# Patient Record
Sex: Male | Born: 1965
Health system: Southern US, Community
[De-identification: ages and names within clinical notes are randomized; demographics above are authoritative.]

## PROBLEM LIST (undated history)

## (undated) DIAGNOSIS — D239 Other benign neoplasm of skin, unspecified: Secondary | ICD-10-CM

## (undated) DIAGNOSIS — N529 Male erectile dysfunction, unspecified: Secondary | ICD-10-CM

## (undated) DIAGNOSIS — N400 Enlarged prostate without lower urinary tract symptoms: Secondary | ICD-10-CM

## (undated) DIAGNOSIS — I10 Essential (primary) hypertension: Secondary | ICD-10-CM

## (undated) DIAGNOSIS — E785 Hyperlipidemia, unspecified: Secondary | ICD-10-CM

## (undated) DIAGNOSIS — L281 Prurigo nodularis: Secondary | ICD-10-CM

## (undated) HISTORY — DX: Benign prostatic hyperplasia without lower urinary tract symptoms: N40.0

## (undated) HISTORY — DX: Other benign neoplasm of skin, unspecified: D23.9

## (undated) HISTORY — DX: Essential (primary) hypertension: I10

## (undated) HISTORY — DX: Prurigo nodularis: L28.1

## (undated) HISTORY — DX: Male erectile dysfunction, unspecified: N52.9

## (undated) HISTORY — DX: Hyperlipidemia, unspecified: E78.5

---

## 2010-06-18 HISTORY — PX: ROTATOR CUFF REPAIR: SHX139

## 2012-11-14 LAB — HM COLONOSCOPY

## 2015-09-29 DIAGNOSIS — I10 Essential (primary) hypertension: Secondary | ICD-10-CM | POA: Insufficient documentation

## 2015-09-29 DIAGNOSIS — N401 Enlarged prostate with lower urinary tract symptoms: Secondary | ICD-10-CM | POA: Insufficient documentation

## 2015-09-29 DIAGNOSIS — N529 Male erectile dysfunction, unspecified: Secondary | ICD-10-CM | POA: Insufficient documentation

## 2015-09-29 DIAGNOSIS — E785 Hyperlipidemia, unspecified: Secondary | ICD-10-CM

## 2015-09-29 DIAGNOSIS — N400 Enlarged prostate without lower urinary tract symptoms: Secondary | ICD-10-CM

## 2015-09-29 HISTORY — DX: Male erectile dysfunction, unspecified: N52.9

## 2015-09-29 HISTORY — DX: Hyperlipidemia, unspecified: E78.5

## 2015-09-29 HISTORY — DX: Essential (primary) hypertension: I10

## 2015-09-29 HISTORY — DX: Benign prostatic hyperplasia without lower urinary tract symptoms: N40.0

## 2015-12-01 DIAGNOSIS — M25511 Pain in right shoulder: Secondary | ICD-10-CM

## 2015-12-01 DIAGNOSIS — M75101 Unspecified rotator cuff tear or rupture of right shoulder, not specified as traumatic: Secondary | ICD-10-CM | POA: Insufficient documentation

## 2015-12-01 DIAGNOSIS — G8929 Other chronic pain: Secondary | ICD-10-CM | POA: Insufficient documentation

## 2016-02-27 DIAGNOSIS — M75101 Unspecified rotator cuff tear or rupture of right shoulder, not specified as traumatic: Secondary | ICD-10-CM

## 2016-02-27 DIAGNOSIS — M12811 Other specific arthropathies, not elsewhere classified, right shoulder: Secondary | ICD-10-CM | POA: Insufficient documentation

## 2016-08-17 LAB — LIPID PANEL
CHOLESTEROL: 128 (ref 0–200)
HDL: 36 (ref 35–70)
LDL CALC: 65
TRIGLYCERIDES: 156 (ref 40–160)

## 2016-08-17 LAB — PSA: PSA: 2.2

## 2016-08-17 LAB — HEPATIC FUNCTION PANEL
ALK PHOS: 63 (ref 25–125)
ALT: 31 (ref 10–40)
AST: 22 (ref 14–40)
Bilirubin, Total: 0.4

## 2016-08-17 LAB — BASIC METABOLIC PANEL
ALBUMIN: 4.4
BUN: 18 (ref 4–21)
CREATININE: 0.9 (ref 0.6–1.3)
Calcium: 9.7
Carbon Dioxide, Total: 27
Chloride: 104
Glucose: 95
POTASSIUM: 4.5 (ref 3.4–5.3)
Sodium: 140 (ref 137–147)
Total Protein: 7.2

## 2017-04-11 ENCOUNTER — Encounter: Payer: Self-pay | Admitting: Family Medicine

## 2017-04-11 ENCOUNTER — Ambulatory Visit (INDEPENDENT_AMBULATORY_CARE_PROVIDER_SITE_OTHER): Payer: Managed Care, Other (non HMO) | Admitting: Family Medicine

## 2017-04-11 VITALS — BP 112/73 | HR 81 | Ht 73.0 in | Wt 241.0 lb

## 2017-04-11 DIAGNOSIS — R3912 Poor urinary stream: Secondary | ICD-10-CM

## 2017-04-11 DIAGNOSIS — N401 Enlarged prostate with lower urinary tract symptoms: Secondary | ICD-10-CM

## 2017-04-11 DIAGNOSIS — N529 Male erectile dysfunction, unspecified: Secondary | ICD-10-CM

## 2017-04-11 DIAGNOSIS — I10 Essential (primary) hypertension: Secondary | ICD-10-CM | POA: Diagnosis not present

## 2017-04-11 DIAGNOSIS — E782 Mixed hyperlipidemia: Secondary | ICD-10-CM | POA: Diagnosis not present

## 2017-04-11 NOTE — Patient Instructions (Addendum)
Thank you for coming in today. We will refill your medicines as needed.  If all is well we recheck in June of 2019 for a well exam exam.  Recheck sooner if needed.  I recommend a flu shot.  Also let me know if you want to restart testosterone.

## 2017-04-11 NOTE — Progress Notes (Signed)
Anthony Lawson is a 51 y.o. male who presents to [facility] [place] today for establish care discussed hypertension hyperlipidemia BPH and erectile dysfunction.  Hypertension: Anthony Lawson takes lisinopril 10 mg daily.  He tolerates his medication well and notes that his blood pressures typically in the systolic range at home.  No chest pain palpitations or shortness of breath.  Hyperlipidemia: Anthony Lawson takes simvastatin daily.  He notes that his cholesterol which was tested in May 2018 is typically well controlled.  He tolerates the medication well with no muscle aches or pains fevers or chills.  BPH: Anthony Lawson has an enlarged prostate however his PSA was tested in May and was 2.2 in the normal range.  He notes mild symptoms but denies symptoms that are obnoxious enough to require treatment.  He is happy with watchful waiting.  Erectile dysfunction: Anthony Lawson has mild erectile dysfunction and takes sildenafil intermittently.  He satisfied with how this is going.  Low testosterone.  Dog has been diagnosed with low testosterone previously.  He did pretty well with topical testosterone but notes it is really expensive.  Off of the testosterone he notes slightly decreased libido and increased fatigue but overall is pretty satisfied with how things are going.  Family history of aortic aneurysm: Anthony Lawson notes multiple family members with aortic aneurysms or aortic dissection.  He is worried about his individual risk and would like a screening test if possible.  He is currently asymptomatic.  Past Medical History:  Diagnosis Date  . BPH (benign prostatic hyperplasia) 09/29/2015  . ED (erectile dysfunction) 09/29/2015  . HTN (hypertension) 09/29/2015  . Hyperlipidemia 09/29/2015   Past Surgical History:  Procedure Laterality Date  . ROTATOR CUFF REPAIR Left 2012   Social History  Substance Use Topics  .  Smoking status: Never Smoker  . Smokeless tobacco: Never Used  . Alcohol use Yes   family history includes Cancer in his mother; Diabetes in his maternal aunt; Heart disease in his brother and father; Hypertension in his father.  ROS as above: No headache, visual changes, nausea, vomiting, diarrhea, constipation, dizziness, abdominal pain, skin rash, fevers, chills, night sweats, weight loss, swollen lymph nodes, body aches, joint swelling, muscle aches, chest pain, shortness of breath, mood changes, visual or auditory hallucinations.   Medications: Current Outpatient Prescriptions  Medication Sig Dispense Refill  . lisinopril (PRINIVIL,ZESTRIL) 10 MG tablet Take by mouth.    . sildenafil (REVATIO) 20 MG tablet Take 1-5 po as needed, not more than once a day    . simvastatin (ZOCOR) 20 MG tablet Take by mouth.     No current facility-administered medications for this visit.    No Known Allergies  Health Maintenance Health Maintenance  Topic Date Due  . HIV Screening  12/20/1980  . COLONOSCOPY  12/21/2015  . INFLUENZA VACCINE  04/11/2018 (Originally 01/16/2017)  . TETANUS/TDAP  12/03/2022     Exam:  BP 112/73   Pulse 81   Ht 6\' 1"  (1.854 m)   Wt 241 lb (109.3 kg)   BMI 31.80 kg/m  Gen: Well NAD HEENT: EOMI,  MMM Lungs: Normal work of breathing. CTABL Heart: RRR no MRG Abd: NABS, Soft. Nondistended, Nontender no palpable pulsatile masses present.  No bruits are present Exts: Brisk capillary refill, warm and well perfused.    No results found for this or any previous visit (from the past 72 hour(s)). No results found.    Assessment and Plan: 51 y.o. male  with  Hypertension well controlled continue current regimen.  Refill medications when needed  Hyperlipidemia well controlled continue current regimen.  Refill medications as needed recheck labs in May 2019.  BPH doing pretty well.  Continue to follow along recheck PSA May 2019.  Erectile dysfunction doing well  refill sildenafil as needed.  Family history of aortic aneurysm: We discussed options.  He does not meet standard criteria for screening for abdominal aortic aneurysm however I think it is reasonable to proceed with a low test for aneurysm.  He understands that he may have to pay for this radiology procedure.  Testosterone: We discussed options plan for watchful waiting.  Orders Placed This Encounter  Procedures  . [place] Screening AAA    Standing Status:   Future    Standing Expiration Date:   06/11/2018    Order Specific Question:   Reason for Exam (SYMPTOM  OR DIAGNOSIS REQUIRED)    Answer:   eval ? AAA. Strong family history and personal history of HTN, HLD    Order Specific Question:   Preferred imaging location?    Answer:   [facility]   Meds ordered this encounter  Medications  . lisinopril (PRINIVIL,ZESTRIL) 10 MG tablet    Sig: Take by mouth.  . sildenafil (REVATIO) 20 MG tablet    Sig: Take 1-5 po as needed, not more than once a day  . simvastatin (ZOCOR) 20 MG tablet    Sig: Take by mouth.     Discussed warning signs or symptoms. Please see discharge instructions. Patient expresses understanding.

## 2017-04-17 ENCOUNTER — Other Ambulatory Visit: Payer: Self-pay | Admitting: Family Medicine

## 2017-04-17 ENCOUNTER — Ambulatory Visit (INDEPENDENT_AMBULATORY_CARE_PROVIDER_SITE_OTHER): Payer: Managed Care, Other (non HMO)

## 2017-04-17 DIAGNOSIS — I10 Essential (primary) hypertension: Secondary | ICD-10-CM

## 2017-04-17 DIAGNOSIS — I77811 Abdominal aortic ectasia: Secondary | ICD-10-CM | POA: Diagnosis not present

## 2017-04-17 DIAGNOSIS — I700 Atherosclerosis of aorta: Secondary | ICD-10-CM | POA: Diagnosis not present

## 2017-04-17 DIAGNOSIS — E782 Mixed hyperlipidemia: Secondary | ICD-10-CM

## 2017-04-18 ENCOUNTER — Encounter: Payer: Self-pay | Admitting: Family Medicine

## 2017-04-18 DIAGNOSIS — I77811 Abdominal aortic ectasia: Secondary | ICD-10-CM | POA: Insufficient documentation

## 2017-05-01 NOTE — Progress Notes (Signed)
11/14/12 

## 2017-05-31 ENCOUNTER — Encounter: Payer: Self-pay | Admitting: Family Medicine

## 2017-08-22 DIAGNOSIS — L281 Prurigo nodularis: Secondary | ICD-10-CM

## 2017-08-22 HISTORY — DX: Prurigo nodularis: L28.1

## 2017-10-01 HISTORY — PX: OTHER SURGICAL HISTORY: SHX169

## 2018-01-02 ENCOUNTER — Ambulatory Visit (INDEPENDENT_AMBULATORY_CARE_PROVIDER_SITE_OTHER): Payer: 59 | Admitting: Family Medicine

## 2018-01-02 ENCOUNTER — Encounter: Payer: Self-pay | Admitting: Family Medicine

## 2018-01-02 VITALS — BP 137/80 | HR 66 | Temp 97.9°F | Wt 230.4 lb

## 2018-01-02 DIAGNOSIS — Z1211 Encounter for screening for malignant neoplasm of colon: Secondary | ICD-10-CM

## 2018-01-02 DIAGNOSIS — N529 Male erectile dysfunction, unspecified: Secondary | ICD-10-CM

## 2018-01-02 DIAGNOSIS — R350 Frequency of micturition: Secondary | ICD-10-CM

## 2018-01-02 DIAGNOSIS — Z0000 Encounter for general adult medical examination without abnormal findings: Secondary | ICD-10-CM

## 2018-01-02 DIAGNOSIS — N401 Enlarged prostate with lower urinary tract symptoms: Secondary | ICD-10-CM | POA: Diagnosis not present

## 2018-01-02 DIAGNOSIS — E782 Mixed hyperlipidemia: Secondary | ICD-10-CM

## 2018-01-02 DIAGNOSIS — Z125 Encounter for screening for malignant neoplasm of prostate: Secondary | ICD-10-CM

## 2018-01-02 DIAGNOSIS — I77811 Abdominal aortic ectasia: Secondary | ICD-10-CM

## 2018-01-02 DIAGNOSIS — I10 Essential (primary) hypertension: Secondary | ICD-10-CM | POA: Diagnosis not present

## 2018-01-02 MED ORDER — LISINOPRIL 10 MG PO TABS: 10.0000 mg | ORAL_TABLET | Freq: Every day | ORAL | 3 refills | Status: DC

## 2018-01-02 MED ORDER — SIMVASTATIN 20 MG PO TABS: 20.0000 mg | ORAL_TABLET | Freq: Every day | ORAL | 3 refills | Status: DC

## 2018-01-02 MED ORDER — SILDENAFIL CITRATE 100 MG PO TABS: 50.0000 mg | ORAL_TABLET | Freq: Every day | ORAL | 11 refills | Status: DC | PRN

## 2018-01-02 MED ORDER — TAMSULOSIN HCL 0.4 MG PO CAPS: 0.4000 mg | ORAL_CAPSULE | Freq: Every day | ORAL | 3 refills | Status: DC

## 2018-01-02 NOTE — Progress Notes (Signed)
Anthony Lawson is a 52 y.o. male who presents to [facility]: [place] today for well adult visit.   Anthony Lawson is doing well overall.  He is attempting to lose weight by eating a healthier diet has lost over 10 pounds.  Additionally he has run out of his cholesterol medication is been out of the simvastatin for about 2 months.  He tolerates it well without any aches and pains.  Additionally he notes that he has been seen by urologist last year and found to have BPH.  He is having some nocturia and urinary frequency symptoms and is interested in medications if possible.  Symptoms have not changed much in the last year and a half.  Additionally has been to a dermatologist at [facility] dermatology and had several moles removed that were noncancerous.  Additionally he takes Viagra occasionally which helps a lot.  He like a refill if possible.   ROS as above:  Past Medical History:  Diagnosis Date  . BPH (benign prostatic hyperplasia) 09/29/2015  . ED (erectile dysfunction) 09/29/2015  . HTN (hypertension) 09/29/2015  . Hyperlipidemia 09/29/2015   Past Surgical History:  Procedure Laterality Date  . ROTATOR CUFF REPAIR Left 2012   Social History   Tobacco Use  . Smoking status: Never Smoker  . Smokeless tobacco: Never Used  Substance Use Topics  . Alcohol use: Yes   family history includes Cancer in his mother; Diabetes in his maternal aunt; Heart disease in his brother and father; Hypertension in his father.  Medications: Current Outpatient Medications  Medication Sig Dispense Refill  . lisinopril (PRINIVIL,ZESTRIL) 10 MG tablet Take 1 tablet (10 mg total) by mouth daily. 90 tablet 3  . sildenafil (REVATIO) 20 MG tablet Take 1-5 po as needed, not more than once a day    . simvastatin (ZOCOR) 20 MG tablet Take 1 tablet (20 mg total) by mouth daily. 90 tablet 3  . sildenafil  (VIAGRA) 100 MG tablet Take 0.5-1 tablets (50-100 mg total) by mouth daily as needed for erectile dysfunction. 30 tablet 11  . tamsulosin (FLOMAX) 0.4 MG CAPS capsule Take 1 capsule (0.4 mg total) by mouth daily. 90 capsule 3   No current facility-administered medications for this visit.    No Known Allergies  Health Maintenance Health Maintenance  Topic Date Due  . COLONOSCOPY  11/14/2017  . INFLUENZA VACCINE  04/11/2018 (Originally 01/16/2018)  . HIV Screening  01/03/2019 (Originally [birth year]/20/1980)  . TETANUS/TDAP  12/03/2022     Exam:  BP 137/80 (BP Location: Right Arm, Patient Position: Sitting, Cuff Size: Normal)   Pulse 66   Temp 97.9 F (36.6 C) (Oral)   Wt 230 lb 6.4 oz (104.5 kg)   BMI 30.40 kg/m  Wt Readings from Last 5 Encounters:  01/02/18 230 lb 6.4 oz (104.5 kg)  04/11/17 241 lb (109.3 kg)     Gen: Well NAD HEENT: EOMI,  MMM Lungs: Normal work of breathing. CTABL Heart: RRR no MRG Abd: NABS, Soft. Nondistended, Nontender Exts: Brisk capillary refill, warm and well perfused.  Psych: Alert and oriented normal speech thought process and affect. Skin fair skin with multiple freckles and nevi.  None severely dysplastic appearing.  Depression screen [facility] 2/9 01/02/2018 04/11/2017  Decreased Interest 0 0  Down, Depressed, Hopeless 0 0  PHQ - 2 Score 0 0  Altered sleeping 0 -  Tired, decreased energy 0 -  Change in appetite 0 -  Feeling  bad or failure about yourself  0 -  Trouble concentrating 0 -  Moving slowly or fidgety/restless 0 -  Suicidal thoughts 0 -  PHQ-9 Score 0 -       Lab and Radiology Results   Chemistry      Component Value Date/Time   NA 140 08/17/2016   K 4.5 08/17/2016   CL 104 08/17/2016   CO2 27 08/17/2016   BUN 18 08/17/2016   CREATININE 0.9 08/17/2016   GLU 95 08/17/2016      Component Value Date/Time   CALCIUM 9.7 08/17/2016   ALKPHOS 63 08/17/2016   AST 22 08/17/2016   ALT 31 08/17/2016     Lab Results  Component Value  Date   CHOL 128 08/17/2016   HDL 36 08/17/2016   LDLCALC 65 08/17/2016   TRIG 156 08/17/2016      Assessment and Plan: 52 y.o. male with well adult. Doing well.  Patient is actively working on weight loss and having success.  He has been out of his simvastatin.  Plan to restart simvastatin and recheck lipids in about 6 weeks with regular basic fasting labs listed below.    Additionally will check PSA for prostate cancer screening and for BPH.  And start Flomax trial.  Recommend continue dermatology skin surveillance checks.  Will request medical records.  Plan to refer to gastroenterology for colon cancer screening colonoscopy.  Last colonoscopy 5 years ago normal initial gastrology note recommends recheck in 5 years.  Patient is not sure why.  I think is reasonable to discuss with gastrology before repeating a colonoscopy.  Document in scanned document.     Orders Placed This Encounter  Procedures  . CBC  . COMPLETE METABOLIC PANEL WITH GFR  . Lipid Panel w/reflex Direct LDL  . PSA  . Ambulatory referral to Gastroenterology    Referral Priority:   Routine    Referral Type:   Consultation    Referral Reason:   Specialty Services Required    Number of Visits Requested:   1   Meds ordered this encounter  Medications  . lisinopril (PRINIVIL,ZESTRIL) 10 MG tablet    Sig: Take 1 tablet (10 mg total) by mouth daily.    Dispense:  90 tablet    Refill:  3  . simvastatin (ZOCOR) 20 MG tablet    Sig: Take 1 tablet (20 mg total) by mouth daily.    Dispense:  90 tablet    Refill:  3  . tamsulosin (FLOMAX) 0.4 MG CAPS capsule    Sig: Take 1 capsule (0.4 mg total) by mouth daily.    Dispense:  90 capsule    Refill:  3  . sildenafil (VIAGRA) 100 MG tablet    Sig: Take 0.5-1 tablets (50-100 mg total) by mouth daily as needed for erectile dysfunction.    Dispense:  30 tablet    Refill:  11     Discussed warning signs or symptoms. Please see discharge instructions. Patient  expresses understanding.

## 2018-01-02 NOTE — Patient Instructions (Signed)
Thank you for coming in today. Resume medicine.  Get fasting labs in about 6 weeks.  You should hear about gastroenterology office.  Return yearly or sooner if needed.

## 2018-01-29 ENCOUNTER — Encounter: Payer: Self-pay | Admitting: Family Medicine

## 2018-01-29 ENCOUNTER — Telehealth: Payer: Self-pay | Admitting: Family Medicine

## 2018-01-29 NOTE — Telephone Encounter (Signed)
Records received from [facility] dermatology. Dysplastic nevi removed from left scapular back October 01, 2017. Irritated lesion injected left leg March 2019  Some of the records are not not readable and will be asked to refax.  However most of the notes are ratable and will be sent to scan as per relevant.

## 2018-02-03 ENCOUNTER — Encounter: Payer: Self-pay | Admitting: Family Medicine

## 2018-02-25 ENCOUNTER — Telehealth: Payer: Self-pay | Admitting: Family Medicine

## 2018-02-25 NOTE — Telephone Encounter (Signed)
Left VM for Pt to return clinic call.  

## 2018-02-25 NOTE — Telephone Encounter (Signed)
[place] [facility] gastroenterology had difficulty contacting you for colonoscopy.  Please let me know if you need any help or would like to have their phone number so you can call them to schedule your colonoscopy.

## 2018-03-21 DIAGNOSIS — M62830 Muscle spasm of back: Secondary | ICD-10-CM | POA: Diagnosis not present

## 2018-03-21 DIAGNOSIS — M47813 Spondylosis without myelopathy or radiculopathy, cervicothoracic region: Secondary | ICD-10-CM | POA: Diagnosis not present

## 2018-03-21 DIAGNOSIS — M47816 Spondylosis without myelopathy or radiculopathy, lumbar region: Secondary | ICD-10-CM | POA: Diagnosis not present

## 2018-03-21 DIAGNOSIS — M545 Low back pain: Secondary | ICD-10-CM | POA: Diagnosis not present

## 2018-04-01 DIAGNOSIS — M47816 Spondylosis without myelopathy or radiculopathy, lumbar region: Secondary | ICD-10-CM | POA: Diagnosis not present

## 2018-04-01 DIAGNOSIS — M47813 Spondylosis without myelopathy or radiculopathy, cervicothoracic region: Secondary | ICD-10-CM | POA: Diagnosis not present

## 2018-04-01 DIAGNOSIS — M62830 Muscle spasm of back: Secondary | ICD-10-CM | POA: Diagnosis not present

## 2018-04-01 DIAGNOSIS — M545 Low back pain: Secondary | ICD-10-CM | POA: Diagnosis not present

## 2018-04-03 DIAGNOSIS — M47816 Spondylosis without myelopathy or radiculopathy, lumbar region: Secondary | ICD-10-CM | POA: Diagnosis not present

## 2018-04-03 DIAGNOSIS — M545 Low back pain: Secondary | ICD-10-CM | POA: Diagnosis not present

## 2018-04-03 DIAGNOSIS — M62830 Muscle spasm of back: Secondary | ICD-10-CM | POA: Diagnosis not present

## 2018-04-03 DIAGNOSIS — M47813 Spondylosis without myelopathy or radiculopathy, cervicothoracic region: Secondary | ICD-10-CM | POA: Diagnosis not present

## 2018-04-08 DIAGNOSIS — I10 Essential (primary) hypertension: Secondary | ICD-10-CM | POA: Diagnosis not present

## 2018-04-08 DIAGNOSIS — N529 Male erectile dysfunction, unspecified: Secondary | ICD-10-CM | POA: Diagnosis not present

## 2018-04-08 DIAGNOSIS — E782 Mixed hyperlipidemia: Secondary | ICD-10-CM | POA: Diagnosis not present

## 2018-04-08 DIAGNOSIS — R350 Frequency of micturition: Secondary | ICD-10-CM | POA: Diagnosis not present

## 2018-04-09 LAB — CBC
HCT: 44.6 % (ref 38.5–50.0)
HEMOGLOBIN: 15.8 g/dL (ref 13.2–17.1)
MCH: 30.7 pg (ref 27.0–33.0)
MCHC: 35.4 g/dL (ref 32.0–36.0)
MCV: 86.6 fL (ref 80.0–100.0)
MPV: 10.2 fL (ref 7.5–12.5)
Platelets: 264 10*3/uL (ref 140–400)
RBC: 5.15 10*6/uL (ref 4.20–5.80)
RDW: 12.7 % (ref 11.0–15.0)
WBC: 6.3 10*3/uL (ref 3.8–10.8)

## 2018-04-09 LAB — COMPLETE METABOLIC PANEL WITH GFR
AG Ratio: 1.6 (calc) (ref 1.0–2.5)
ALBUMIN MSPROF: 4.4 g/dL (ref 3.6–5.1)
ALT: 30 U/L (ref 9–46)
AST: 21 U/L (ref 10–35)
Alkaline phosphatase (APISO): 55 U/L (ref 40–115)
BUN: 15 mg/dL (ref 7–25)
CALCIUM: 9.6 mg/dL (ref 8.6–10.3)
CO2: 25 mmol/L (ref 20–32)
CREATININE: 0.89 mg/dL (ref 0.70–1.33)
Chloride: 103 mmol/L (ref 98–110)
GFR, EST NON AFRICAN AMERICAN: 98 mL/min/{1.73_m2} (ref >=60)
GFR, Est African American: 114 mL/min/{1.73_m2} (ref >=60)
Globulin: 2.8 g/dL (calc) (ref 1.9–3.7)
Glucose, Bld: 92 mg/dL (ref 65–99)
Potassium: 4.2 mmol/L (ref 3.5–5.3)
SODIUM: 138 mmol/L (ref 135–146)
Total Bilirubin: 0.5 mg/dL (ref 0.2–1.2)
Total Protein: 7.2 g/dL (ref 6.1–8.1)

## 2018-04-09 LAB — LIPID PANEL W/REFLEX DIRECT LDL
CHOL/HDL RATIO: 3.8 (calc) (ref <5.0)
Cholesterol: 146 mg/dL (ref <200)
HDL: 38 mg/dL — AB (ref >40)
LDL Cholesterol (Calc): 82 mg/dL (calc)
NON-HDL CHOLESTEROL (CALC): 108 mg/dL (ref <130)
Triglycerides: 164 mg/dL — ABNORMAL HIGH (ref <150)

## 2018-04-09 LAB — PSA: PSA: 2.6 ng/mL (ref <=4.0)

## 2018-04-11 DIAGNOSIS — M47813 Spondylosis without myelopathy or radiculopathy, cervicothoracic region: Secondary | ICD-10-CM | POA: Diagnosis not present

## 2018-04-11 DIAGNOSIS — M62830 Muscle spasm of back: Secondary | ICD-10-CM | POA: Diagnosis not present

## 2018-04-11 DIAGNOSIS — M545 Low back pain: Secondary | ICD-10-CM | POA: Diagnosis not present

## 2018-04-11 DIAGNOSIS — M47816 Spondylosis without myelopathy or radiculopathy, lumbar region: Secondary | ICD-10-CM | POA: Diagnosis not present

## 2018-04-18 DIAGNOSIS — M545 Low back pain: Secondary | ICD-10-CM | POA: Diagnosis not present

## 2018-04-18 DIAGNOSIS — M62830 Muscle spasm of back: Secondary | ICD-10-CM | POA: Diagnosis not present

## 2018-04-18 DIAGNOSIS — M47816 Spondylosis without myelopathy or radiculopathy, lumbar region: Secondary | ICD-10-CM | POA: Diagnosis not present

## 2018-04-18 DIAGNOSIS — M47813 Spondylosis without myelopathy or radiculopathy, cervicothoracic region: Secondary | ICD-10-CM | POA: Diagnosis not present

## 2018-04-21 DIAGNOSIS — M47813 Spondylosis without myelopathy or radiculopathy, cervicothoracic region: Secondary | ICD-10-CM | POA: Diagnosis not present

## 2018-04-21 DIAGNOSIS — M47816 Spondylosis without myelopathy or radiculopathy, lumbar region: Secondary | ICD-10-CM | POA: Diagnosis not present

## 2018-04-21 DIAGNOSIS — M62830 Muscle spasm of back: Secondary | ICD-10-CM | POA: Diagnosis not present

## 2018-04-21 DIAGNOSIS — M545 Low back pain: Secondary | ICD-10-CM | POA: Diagnosis not present

## 2018-05-02 DIAGNOSIS — M545 Low back pain: Secondary | ICD-10-CM | POA: Diagnosis not present

## 2018-05-02 DIAGNOSIS — M62830 Muscle spasm of back: Secondary | ICD-10-CM | POA: Diagnosis not present

## 2018-05-02 DIAGNOSIS — M47813 Spondylosis without myelopathy or radiculopathy, cervicothoracic region: Secondary | ICD-10-CM | POA: Diagnosis not present

## 2018-05-02 DIAGNOSIS — M47816 Spondylosis without myelopathy or radiculopathy, lumbar region: Secondary | ICD-10-CM | POA: Diagnosis not present

## 2018-05-05 DIAGNOSIS — M62830 Muscle spasm of back: Secondary | ICD-10-CM | POA: Diagnosis not present

## 2018-05-05 DIAGNOSIS — M47816 Spondylosis without myelopathy or radiculopathy, lumbar region: Secondary | ICD-10-CM | POA: Diagnosis not present

## 2018-05-05 DIAGNOSIS — M47813 Spondylosis without myelopathy or radiculopathy, cervicothoracic region: Secondary | ICD-10-CM | POA: Diagnosis not present

## 2018-05-05 DIAGNOSIS — M545 Low back pain: Secondary | ICD-10-CM | POA: Diagnosis not present

## 2018-05-12 DIAGNOSIS — M47816 Spondylosis without myelopathy or radiculopathy, lumbar region: Secondary | ICD-10-CM | POA: Diagnosis not present

## 2018-05-12 DIAGNOSIS — M62830 Muscle spasm of back: Secondary | ICD-10-CM | POA: Diagnosis not present

## 2018-05-12 DIAGNOSIS — M545 Low back pain: Secondary | ICD-10-CM | POA: Diagnosis not present

## 2018-05-12 DIAGNOSIS — M47813 Spondylosis without myelopathy or radiculopathy, cervicothoracic region: Secondary | ICD-10-CM | POA: Diagnosis not present

## 2018-05-19 ENCOUNTER — Encounter: Payer: Self-pay | Admitting: Family Medicine

## 2018-05-19 ENCOUNTER — Ambulatory Visit: Payer: 59 | Admitting: Family Medicine

## 2018-05-19 ENCOUNTER — Ambulatory Visit (INDEPENDENT_AMBULATORY_CARE_PROVIDER_SITE_OTHER): Payer: BLUE CROSS/BLUE SHIELD

## 2018-05-19 VITALS — BP 153/88 | HR 80 | Wt 236.0 lb

## 2018-05-19 DIAGNOSIS — M76891 Other specified enthesopathies of right lower limb, excluding foot: Secondary | ICD-10-CM

## 2018-05-19 DIAGNOSIS — M25551 Pain in right hip: Secondary | ICD-10-CM | POA: Diagnosis not present

## 2018-05-19 NOTE — Progress Notes (Signed)
Note duplication error 

## 2018-05-19 NOTE — Patient Instructions (Signed)
Thank you for coming in today. Do the stretching as much as you can.  Get xray now on your way out.  Attend Pt.  Do the exercises about 30 reps 2-3x daily.  Go from the short position to long slowly against resistance.

## 2018-05-19 NOTE — Progress Notes (Signed)
Anthony Lawson is a 52 y.o. male who presents to [facility] today for right groin and lateral thigh pain. Anthony Lawson was doing sit-ups 2.5 weeks ago, and felt a pulling in his right groin. The pain spread to his lateral right thigh and right lower back. He notes that it feels like a cramp - similar to a calf cramp. He does have some numbness on his right lateral thigh. He feels alright when he is sitting, but when he stands up the pain worsens in his groin. He has difficulty sleeping at night, because the pain is worse when he lays on his back. He has been stretching and using Advil without relief. He denies difficulty getting out of the car or putting on his shoes.   Patient has an established relationship with a chiropractor and has been receiving chiropractic care for this injury for the last 2-1/2 weeks or so.  He notes it is not improving.  ROS:  As above  Exam:  BP (!) 153/88   Pulse 80   Wt 236 lb (107 kg)   BMI 31.14 kg/m  General: Well Developed, well nourished, and in no acute distress.  Neuro/Psych: Alert and oriented x3, extra-ocular muscles intact, able to move all 4 extremities, sensation grossly intact. Skin: Warm and dry, no rashes noted.  Respiratory: Not using accessory muscles, speaking in full sentences, trachea midline.  Cardiovascular: Pulses palpable, no extremity edema. Abdomen: Does not appear distended. MSK:  Back: No tenderness to palpation.  Pain reproduced with back extension and lateral rotation to the right.  Otherwise normal ROM.  Strength 5/5 when standing on toes and heels.  Pulses and capillary refill normal.  Right hip: No tenderness to palpation. Normal ROM.  Strength 5/5 with internal and external rotation, hip flexion, and hip abduction when seated. Pain reproduced with resisted hip flexion and strength 4/5 right hip flexion with supine position  Left hip: Normal appearing, normal ROM,  strength intact. Pulses and capillary refill normal.    Lab and Radiology Results No results found for this or any previous visit (from the past 72 hour(s)). Dg Hip Unilat With Pelvis 2-3 Views Right  Result Date: 05/19/2018 CLINICAL DATA:  Acute right hip pain without definite injury. EXAM: DG HIP (WITH OR WITHOUT PELVIS) 2-3V RIGHT COMPARISON:  None. FINDINGS: There is no evidence of hip fracture or dislocation. There is no evidence of arthropathy or other focal bone abnormality. IMPRESSION: Negative. Electronically Signed   By: Marijo Conception, M.D.   On: 05/19/2018 19:39  I personally (independently) visualized and performed the interpretation of the images attached in this note. .    Assessment and Plan: 52 y.o. male with  Hip flexor strain: Anthony Lawson likely strained his hip flexor while doing sit-ups 2.5 weeks ago. His physical exam revealed pain with hip flexion and back extension. X-ray revealed largely normal exam. Advised PT - However, pt unlikely to make it to many appointments as he travels for work. Gave work-out bands and demonstrated exercises for hip flexor strengthening and stretching.   Recheck in a few weeks if not improving.     Orders Placed This Encounter  Procedures  . DG HIP UNILAT WITH PELVIS 2-3 VIEWS RIGHT    Standing Status:   Future    Number of Occurrences:   1    Standing Expiration Date:   07/21/2019    Order Specific Question:   Reason for Exam (SYMPTOM  OR DIAGNOSIS  REQUIRED)    Answer:   eval pain gorin suspect hip flexor strain    Order Specific Question:   Preferred imaging location?    Answer:   [facility]    Order Specific Question:   Radiology Contrast Protocol - do NOT remove file path    Answer:   [email or web address]  . Ambulatory referral to Physical Therapy    Referral Priority:   Routine    Referral Type:   Physical Medicine    Referral Reason:   Specialty Services Required     Requested Specialty:   Physical Therapy   No orders of the defined types were placed in this encounter.   Historical information moved to improve visibility of documentation.  Past Medical History:  Diagnosis Date  . BPH (benign prostatic hyperplasia) 09/29/2015  . ED (erectile dysfunction) 09/29/2015  . HTN (hypertension) 09/29/2015  . Hyperlipidemia 09/29/2015  . Multiple dysplastic nevi   . Prurigo nodularis 08/22/2017   Injected left lateral lower leg [facility] dermatology   Past Surgical History:  Procedure Laterality Date  . Dysplastic nevi removal left scapular back April 2019 Left 10/01/2017   [facility]  . ROTATOR CUFF REPAIR Left 2012   Social History   Tobacco Use  . Smoking status: Never Smoker  . Smokeless tobacco: Never Used  Substance Use Topics  . Alcohol use: Yes   family history includes Cancer in his mother; Diabetes in his maternal aunt; Heart disease in his brother and father; Hypertension in his father.  Medications: Current Outpatient Medications  Medication Sig Dispense Refill  . lisinopril (PRINIVIL,ZESTRIL) 10 MG tablet Take 1 tablet (10 mg total) by mouth daily. 90 tablet 3  . sildenafil (REVATIO) 20 MG tablet Take 1-5 po as needed, not more than once a day    . sildenafil (VIAGRA) 100 MG tablet Take 0.5-1 tablets (50-100 mg total) by mouth daily as needed for erectile dysfunction. 30 tablet 11  . simvastatin (ZOCOR) 20 MG tablet Take 1 tablet (20 mg total) by mouth daily. 90 tablet 3  . tamsulosin (FLOMAX) 0.4 MG CAPS capsule Take 1 capsule (0.4 mg total) by mouth daily. 90 capsule 3   No current facility-administered medications for this visit.    No Known Allergies    Discussed warning signs or symptoms. Please see discharge instructions. Patient expresses understanding.   I personally was present and performed or re-performed the history, physical exam and medical decision-making activities of this service and have  verified that the service and findings are accurately documented in the student's note. ___________________________________________ Lynne Leader M.D., ABFM., CAQSM. Primary Care and Sports Medicine Adjunct Instructor of [employer]

## 2018-10-30 ENCOUNTER — Telehealth: Payer: Self-pay | Admitting: Family Medicine

## 2018-10-30 DIAGNOSIS — E782 Mixed hyperlipidemia: Secondary | ICD-10-CM

## 2018-10-30 DIAGNOSIS — I10 Essential (primary) hypertension: Secondary | ICD-10-CM

## 2018-10-30 NOTE — Telephone Encounter (Signed)
Patient states that her and husband need lab work called in so they can then schedule a virtual visit with PCP. States that PCP is aware of labs needed. Please advise.

## 2018-10-30 NOTE — Telephone Encounter (Signed)
Pt advised.

## 2018-10-30 NOTE — Telephone Encounter (Signed)
Labs ordered in preparation for upcoming visit.

## 2018-11-04 LAB — LIPID PANEL
Cholesterol: 129 mg/dL (ref <200)
HDL: 38 mg/dL — ABNORMAL LOW (ref >=40)
LDL Cholesterol (Calc): 74 mg/dL (calc)
Non-HDL Cholesterol (Calc): 91 mg/dL (calc) (ref <130)
Total CHOL/HDL Ratio: 3.4 (calc) (ref <5.0)
Triglycerides: 91 mg/dL (ref <150)

## 2018-11-04 LAB — CBC
HCT: 45.5 % (ref 38.5–50.0)
Hemoglobin: 15.5 g/dL (ref 13.2–17.1)
MCH: 29.8 pg (ref 27.0–33.0)
MCHC: 34.1 g/dL (ref 32.0–36.0)
MCV: 87.3 fL (ref 80.0–100.0)
MPV: 10.6 fL (ref 7.5–12.5)
Platelets: 272 10*3/uL (ref 140–400)
RBC: 5.21 10*6/uL (ref 4.20–5.80)
RDW: 12.2 % (ref 11.0–15.0)
WBC: 6.2 10*3/uL (ref 3.8–10.8)

## 2018-11-04 LAB — COMPLETE METABOLIC PANEL WITH GFR
AG Ratio: 1.8 (calc) (ref 1.0–2.5)
ALT: 24 U/L (ref 9–46)
AST: 19 U/L (ref 10–35)
Albumin: 4.4 g/dL (ref 3.6–5.1)
Alkaline phosphatase (APISO): 68 U/L (ref 35–144)
BUN: 15 mg/dL (ref 7–25)
CO2: 25 mmol/L (ref 20–32)
Calcium: 9.4 mg/dL (ref 8.6–10.3)
Chloride: 106 mmol/L (ref 98–110)
Creat: 0.97 mg/dL (ref 0.70–1.33)
GFR, Est African American: 104 mL/min/{1.73_m2} (ref >=60)
GFR, Est Non African American: 89 mL/min/{1.73_m2} (ref >=60)
Globulin: 2.4 g/dL (calc) (ref 1.9–3.7)
Glucose, Bld: 96 mg/dL (ref 65–99)
Potassium: 4.2 mmol/L (ref 3.5–5.3)
Sodium: 138 mmol/L (ref 135–146)
Total Bilirubin: 0.6 mg/dL (ref 0.2–1.2)
Total Protein: 6.8 g/dL (ref 6.1–8.1)

## 2018-11-24 ENCOUNTER — Ambulatory Visit (INDEPENDENT_AMBULATORY_CARE_PROVIDER_SITE_OTHER): Payer: Managed Care, Other (non HMO)

## 2018-11-24 ENCOUNTER — Encounter: Payer: Self-pay | Admitting: Family Medicine

## 2018-11-24 ENCOUNTER — Ambulatory Visit (INDEPENDENT_AMBULATORY_CARE_PROVIDER_SITE_OTHER): Payer: Managed Care, Other (non HMO) | Admitting: Family Medicine

## 2018-11-24 ENCOUNTER — Other Ambulatory Visit: Payer: Self-pay

## 2018-11-24 VITALS — BP 131/74 | HR 66 | Temp 97.9°F | Wt 221.0 lb

## 2018-11-24 DIAGNOSIS — Z0000 Encounter for general adult medical examination without abnormal findings: Secondary | ICD-10-CM | POA: Diagnosis not present

## 2018-11-24 DIAGNOSIS — M25521 Pain in right elbow: Secondary | ICD-10-CM

## 2018-11-24 DIAGNOSIS — I10 Essential (primary) hypertension: Secondary | ICD-10-CM | POA: Diagnosis not present

## 2018-11-24 DIAGNOSIS — I77811 Abdominal aortic ectasia: Secondary | ICD-10-CM

## 2018-11-24 DIAGNOSIS — E782 Mixed hyperlipidemia: Secondary | ICD-10-CM

## 2018-11-24 DIAGNOSIS — N401 Enlarged prostate with lower urinary tract symptoms: Secondary | ICD-10-CM | POA: Diagnosis not present

## 2018-11-24 DIAGNOSIS — R350 Frequency of micturition: Secondary | ICD-10-CM

## 2018-11-24 DIAGNOSIS — Z6829 Body mass index (BMI) 29.0-29.9, adult: Secondary | ICD-10-CM

## 2018-11-24 DIAGNOSIS — N529 Male erectile dysfunction, unspecified: Secondary | ICD-10-CM

## 2018-11-24 MED ORDER — LISINOPRIL 10 MG PO TABS: 10.0000 mg | ORAL_TABLET | Freq: Every day | ORAL | 3 refills | Status: DC

## 2018-11-24 MED ORDER — SILDENAFIL CITRATE 100 MG PO TABS: 50.0000 mg | ORAL_TABLET | Freq: Every day | ORAL | 11 refills | Status: DC | PRN

## 2018-11-24 MED ORDER — SIMVASTATIN 20 MG PO TABS: 20.0000 mg | ORAL_TABLET | Freq: Every day | ORAL | 3 refills | Status: DC

## 2018-11-24 MED ORDER — TAMSULOSIN HCL 0.4 MG PO CAPS: 0.4000 mg | ORAL_CAPSULE | Freq: Every day | ORAL | 3 refills | Status: DC

## 2018-11-24 NOTE — Progress Notes (Signed)
Anthony Lawson is a 53 y.o. male who presents to [facility]: [facility] today for well adult visit.    Patient has pertinent medical history for hypertension, hyperlipidemia, and BPH.  He takes lisinopril simvastatin and tamsulosin.  Additionally he takes Viagra as needed for ED.  He had fasting labs on May 19 preparation for today's physical which showed well-controlled lipids.  He has a was checked last October 2019 was normal.  Additionally he has an ectatic abdominal aorta seen on ultrasound 2018 with plan to repeat ultrasound in 5 years.  Notes some right elbow pain.  He notes he cannot recall any injury but notes with full extension and full supination is a bit painful.  He is tried some of the counter medications for pain which is helped a bit.  No fevers or chills. ROS as above:  Past Medical History:  Diagnosis Date  . BPH (benign prostatic hyperplasia) 09/29/2015  . ED (erectile dysfunction) 09/29/2015  . HTN (hypertension) 09/29/2015  . Hyperlipidemia 09/29/2015  . Multiple dysplastic nevi   . Prurigo nodularis 08/22/2017   Injected left lateral lower leg [facility]   Past Surgical History:  Procedure Laterality Date  . Dysplastic nevi removal left scapular back April 2019 Left 10/01/2017   [facility]  . ROTATOR CUFF REPAIR Left 2012   Social History   Tobacco Use  . Smoking status: Never Smoker  . Smokeless tobacco: Never Used  Substance Use Topics  . Alcohol use: Yes   family history includes Cancer in his mother; Diabetes in his maternal aunt; Heart disease in his brother and father; Hypertension in his father.  Medications: Current Outpatient Medications  Medication Sig Dispense Refill  . lisinopril (ZESTRIL) 10 MG tablet Take 1 tablet (10 mg total) by mouth daily. 90 tablet 3  . sildenafil (VIAGRA) 100 MG tablet Take  0.5-1 tablets (50-100 mg total) by mouth daily as needed for erectile dysfunction. 30 tablet 11  . simvastatin (ZOCOR) 20 MG tablet Take 1 tablet (20 mg total) by mouth daily. 90 tablet 3  . tamsulosin (FLOMAX) 0.4 MG CAPS capsule Take 1 capsule (0.4 mg total) by mouth daily. 90 capsule 3   No current facility-administered medications for this visit.    No Known Allergies  Health Maintenance Health Maintenance  Topic Date Due  . HIV Screening  01/03/2019 (Originally 12/20/1980)  . COLONOSCOPY  02/26/2019 (Originally 11/14/2017)  . INFLUENZA VACCINE  01/17/2019  . TETANUS/TDAP  12/03/2022     Exam:  BP 131/74   Pulse 66   Temp 97.9 F (36.6 C) (Oral)   Wt 221 lb (100.2 kg)   BMI 29.16 kg/m  Wt Readings from Last 5 Encounters:  11/24/18 221 lb (100.2 kg)  05/19/18 236 lb (107 kg)  01/02/18 230 lb 6.4 oz (104.5 kg)  04/11/17 241 lb (109.3 kg)      Gen: Well NAD HEENT: EOMI,  MMM Lungs: Normal work of breathing. CTABL Heart: RRR no MRG Abd: NABS, Soft. Nondistended, Nontender Exts: Brisk capillary refill, warm and well perfused.  Psych: Oriented normal speech thought process and affect. Right elbow normal-appearing lacks full extension by about 5 degrees and lacks full supination by about 5 degrees. Mildly tender to palpation at the radial head.  Some crepitations with range of motion.  No locking or catching.  Normal strength. Rectal exam: Normal-appearing external anus.  Small palpable hemorrhoid present.  Prostate is slightly enlarged  with no nodules or tenderness.  Depression screen [facility] 2/9 11/24/2018 01/02/2018 04/11/2017  Decreased Interest 1 0 0  Down, Depressed, Hopeless 1 0 0  PHQ - 2 Score 2 0 0  Altered sleeping 0 0 -  Tired, decreased energy 0 0 -  Change in appetite 0 0 -  Feeling bad or failure about yourself  1 0 -  Trouble concentrating 0 0 -  Moving slowly or fidgety/restless 0 0 -  Suicidal thoughts 0 0 -  PHQ-9 Score 3 0 -  Difficult doing  work/chores Not difficult at all - -       Lab and Radiology Results Recent Results (from the past 2160 hour(s))  CBC     Status: None   Collection Time: 11/04/18  7:39 AM  Result Value Ref Range   WBC 6.2 3.8 - 10.8 Thousand/uL   RBC 5.21 4.20 - 5.80 Million/uL   Hemoglobin 15.5 13.2 - 17.1 g/dL   HCT 45.5 38.5 - 50.0 %   MCV 87.3 80.0 - 100.0 fL   MCH 29.8 27.0 - 33.0 pg   MCHC 34.1 32.0 - 36.0 g/dL   RDW 12.2 11.0 - 15.0 %   Platelets 272 140 - 400 Thousand/uL   MPV 10.6 7.5 - 12.5 fL  COMPLETE METABOLIC PANEL WITH GFR     Status: None   Collection Time: 11/04/18  7:39 AM  Result Value Ref Range   Glucose, Bld 96 65 - 99 mg/dL    Comment: .            Fasting reference interval .    BUN 15 7 - 25 mg/dL   Creat 0.97 0.70 - 1.33 mg/dL    Comment: For patients >79 years of age, the reference limit for Creatinine is approximately 13% higher for people identified as African-American. .    GFR, Est Non African American 89 > OR = 60 mL/min/1.5m2   GFR, Est African American 104 > OR = 60 mL/min/1.67m2   BUN/Creatinine Ratio NOT APPLICABLE 6 - 22 (calc)   Sodium 138 135 - 146 mmol/L   Potassium 4.2 3.5 - 5.3 mmol/L   Chloride 106 98 - 110 mmol/L   CO2 25 20 - 32 mmol/L   Calcium 9.4 8.6 - 10.3 mg/dL   Total Protein 6.8 6.1 - 8.1 g/dL   Albumin 4.4 3.6 - 5.1 g/dL   Globulin 2.4 1.9 - 3.7 g/dL (calc)   AG Ratio 1.8 1.0 - 2.5 (calc)   Total Bilirubin 0.6 0.2 - 1.2 mg/dL   Alkaline phosphatase (APISO) 68 35 - 144 U/L   AST 19 10 - 35 U/L   ALT 24 9 - 46 U/L  Lipid Profile     Status: Abnormal   Collection Time: 11/04/18  7:39 AM  Result Value Ref Range   Cholesterol 129 <200 mg/dL   HDL 38 (L) > OR = 40 mg/dL   Triglycerides 91 <150 mg/dL   LDL Cholesterol (Calc) 74 mg/dL (calc)    Comment: Reference range: <100 . Desirable range <100 mg/dL for primary prevention;   <70 mg/dL for patients with CHD or diabetic patients  with > or = 2 CHD risk factors. Marland Kitchen LDL-C is  now calculated using the Martin-Hopkins  calculation, which is a validated novel method providing  better accuracy than the Friedewald equation in the  estimation of LDL-C.  Cresenciano Genre et al. Annamaria Helling. 2426;834(19): 2061-2068  (http://education.[employer].com/faq/FAQ164)    Total CHOL/HDL Ratio 3.4 <5.0 (calc)  Non-HDL Cholesterol (Calc) 91 <130 mg/dL (calc)    Comment: For patients with diabetes plus 1 major ASCVD risk  factor, treating to a non-HDL-C goal of <100 mg/dL  (LDL-C of <70 mg/dL) is considered a therapeutic  option.       Assessment and Plan: 53 y.o. male with  Well adult.  Doing reasonably well.  Right elbow pain: Mildly present.  Plan for x-ray to rule out significant cause.  However likely DJD.  Hypertension hyperlipidemia overweight doing well.  Patient continues to lose weight.  Continue healthy lifestyle and exercise.  Labs checked recently normal.  Continue current regimen.  BPH: Doing well.  Recheck PSA in October 2020.  That will be 1 year follow-up.  PDMP not reviewed this encounter. Orders Placed This Encounter  Procedures  . DG Elbow Complete Right    Standing Status:   Future    Number of Occurrences:   1    Standing Expiration Date:   01/24/2020    Order Specific Question:   Reason for Exam (SYMPTOM  OR DIAGNOSIS REQUIRED)    Answer:   eval right elbow pain and loss of full extension    Order Specific Question:   Preferred imaging location?    Answer:   [facility]    Order Specific Question:   Radiology Contrast Protocol - do NOT remove file path    Answer:   \\[email or web address]s.pdf   Meds ordered this encounter  Medications  . lisinopril (ZESTRIL) 10 MG tablet    Sig: Take 1 tablet (10 mg total) by mouth daily.    Dispense:  90 tablet    Refill:  3  . simvastatin (ZOCOR) 20 MG tablet    Sig: Take 1 tablet (20 mg total) by mouth daily.    Dispense:  90 tablet    Refill:  3  . tamsulosin  (FLOMAX) 0.4 MG CAPS capsule    Sig: Take 1 capsule (0.4 mg total) by mouth daily.    Dispense:  90 capsule    Refill:  3  . sildenafil (VIAGRA) 100 MG tablet    Sig: Take 0.5-1 tablets (50-100 mg total) by mouth daily as needed for erectile dysfunction.    Dispense:  30 tablet    Refill:  11     Discussed warning signs or symptoms. Please see discharge instructions. Patient expresses understanding.

## 2018-11-24 NOTE — Patient Instructions (Signed)
Thank you for coming in today. Get xray now. I will contact you with results.  Recheck in 12 months if all is well.  Plan recheck PSA in October.  Get flu vaccine.  Ask your insurance about Shingrix vaccine.   Continue healthy lifestyle and medication.   Call or go to the emergency room if you get worse, have trouble breathing, have chest pains, or palpitations.

## 2019-01-05 ENCOUNTER — Encounter: Payer: 59 | Admitting: Family Medicine

## 2019-02-11 LAB — HM DIABETES EYE EXAM

## 2019-03-03 ENCOUNTER — Encounter: Payer: Self-pay | Admitting: Family Medicine

## 2019-03-03 ENCOUNTER — Other Ambulatory Visit: Payer: Self-pay

## 2019-03-03 ENCOUNTER — Ambulatory Visit (INDEPENDENT_AMBULATORY_CARE_PROVIDER_SITE_OTHER): Payer: Managed Care, Other (non HMO)

## 2019-03-03 ENCOUNTER — Ambulatory Visit: Payer: Managed Care, Other (non HMO) | Admitting: Family Medicine

## 2019-03-03 VITALS — BP 130/69 | HR 71 | Temp 98.0°F | Ht 72.0 in | Wt 226.8 lb

## 2019-03-03 DIAGNOSIS — M25512 Pain in left shoulder: Secondary | ICD-10-CM

## 2019-03-03 NOTE — Patient Instructions (Signed)
Thank you for coming in today. You should hear soon about MRI.  Get xray now.  We will have you schedule with me 1 hour prior to MRI to have injection first.   We will review results when they are back.    SLAP Lesions  Superior labrum anterior posterior (SLAP) lesions are injuries to part of the connective tissue (cartilage) of the shoulder joint. The top of the upper arm bone (humerus) fits into a socket in the shoulder blade to form the shoulder joint. There is a firm rim of cartilage (labrum) around the edge of the socket. The labrum helps to deepen the socket and hold the humerus in place. If a certain part of the labrum becomes frayed or torn, it is called a SLAP lesion. A SLAP lesion can cause shoulder pain, instability, and weakness. SLAP lesions are common among athletes who play sports that involve repeated overhead movements. SLAP lesions may include a tear in the cord of tissue that attaches the muscle in the front of the upper arm to the shoulder blade (proximal biceps tendon). What are the causes? This condition may be caused by:  A sudden (acute) injury, which can result from: ? Falling on an outstretched arm. ? Movement of the shoulder joint out of its normal place (dislocation). ? A direct hit to the shoulder.  Wear and tear over time, which can result from doing activities or sports that involve overhead arm movements. What increases the risk? The following factors may make you more likely to develop this condition:  Having had a dislocated shoulder in the past.  Being age 86 or older.  Playing certain sports, such as: ? Sports that involve repeated overhead movements, such as baseball or volleyball. ? Sports that put backward pressure on the arms when the arms are overhead, such as gymnastics or basketball. ? Contact sports.  Lifting weights. What are the signs or symptoms? The main symptom of this condition is shoulder pain that gets worse when lifting a heavy  object or raising the arm overhead. Other symptoms include:  Feeling like your shoulder is locking, catching, grinding, or popping.  Loss of strength.  Stiffness and limited range of motion.  Loss of throwing power. How is this diagnosed? This condition may be diagnosed based on:  Your symptoms.  Your medical history.  A physical exam.  Imaging tests, such as an MRI or a CT scan. How is this treated? Treatment for this condition may include:  Resting your shoulder by avoiding activities that cause shoulder pain.  Taking NSAIDs to help reduce pain and swelling.  Having physical therapy to improve strength and range of motion.  Having surgery. This may be done if other treatment methods do not help. Surgery may involve: ? Removing frayed pieces of the labrum. ? Repairing tears. ? Reattaching the labrum. ? Repairing the biceps tendon. Follow these instructions at home: Managing pain, stiffness, and swelling   If directed, put ice on the injured area. ? Put ice in a plastic bag. ? Place a towel between your skin and the bag. ? Leave the ice on for 20 minutes, 2-3 times a day.  Move your fingers often to reduce stiffness and swelling.  Raise (elevate) the injured area above the level of your heart while you are sitting or lying down. Activity  Do exercises as told by your health care provider.  Return to your normal activities as told by your health care provider. Ask your health care provider what  activities are safe for you.  Ask your health care provider when it is safe for you to drive while your shoulder is healing. General instructions  Take over-the-counter and prescription medicines only as told by your health care provider.  Do not use any products that contain nicotine or tobacco, such as cigarettes, e-cigarettes, and chewing tobacco. If you need help quitting, ask your health care provider.  Keep all follow-up visits as told by your health care  provider. This is important. How is this prevented?  Warm up and stretch before being active.  Cool down and stretch after being active.  Give your body time to rest between periods of activity.  Maintain physical fitness, including strength and flexibility.  Be safe and responsible while being active. This will help you to avoid falls. Contact a health care provider if:  Your symptoms have not improved after 6 months of treatment.  Your symptoms get worse instead of getting better. Summary  Superior labrum anterior posterior (SLAP) lesions are injuries to part of the connective tissue (cartilage) of the shoulder joint.  This condition is usually caused by a sudden (acute) injury due to a fall or direct hit to the shoulder. It can also be caused by wear and tear over time, which can result from doing activities or sports that involve overhead arm movements.  It may be treated with rest, medicines, physical therapy, and surgery.  Contact a health care provider if your symptoms get worse instead of getting better. This information is not intended to replace advice given to you by your health care provider. Make sure you discuss any questions you have with your health care provider. Document Released: 06/04/2005 Document Revised: 07/22/2018 Document Reviewed: 07/22/2018 Elsevier Patient Education  2020 [employer].

## 2019-03-03 NOTE — Progress Notes (Signed)
Anthony Lawson is a 53 y.o. male who presents to [place]: [place] today for left shoulder pain.  About 3 weeks ago Anthony Lawson was in his normal state of health exercising doing bench press at home gym.  He felt a sudden pop or pain sensation in the posterior aspect of his left shoulder.  This is associated with weakness and soreness.  He has had problems since with catching clicking and locking in his left shoulder and pain.  He stopped lifting weights but he notes even pushing a lawnmower cause significant dysfunction and pain in his left shoulder.  He notes weakness associated with shoulder motion.  He denies any pain radiating down his arm weakness or numbness below the level of the elbow.   He has a past surgical history for left rotator cuff repair and subacromial decompression in [place] in 2012    [place]: Had RTC surgery 2012 left. In [place].   ROS as above:  Exam:  BP 130/69   Pulse 71   Temp 98 F (36.7 C) (Oral)   Ht 6' (1.829 m)   Wt 226 lb 12.8 oz (102.9 kg)   BMI 30.76 kg/m  Wt Readings from Last 5 Encounters:  03/03/19 226 lb 12.8 oz (102.9 kg)  11/24/18 221 lb (100.2 kg)  05/19/18 236 lb (107 kg)  01/02/18 230 lb 6.4 oz (104.5 kg)  04/11/17 241 lb (109.3 kg)    Gen: Well NAD HEENT: EOMI,  MMM Lungs: Normal work of breathing. CTABL Heart: RRR no MRG Abd: NABS, Soft. Nondistended, Nontender Exts: Brisk capillary refill, warm and well perfused.  Left shoulder normal-appearing nontender. Normal range of motion. Strength: 4/5 abduction.  3/5 external rotation.  5/5 internal rotation. Positive empty can test. Mildly positive Hawkins and Neer's test. Negative Yergason's and speeds test. Positive O'Brien's test. Positive posterior apprehension test and relocation test.  Negative anterior apprehension and relocation test. Positive clunk test.   Contralateral right shoulder normal-appearing nontender normal range of motion. Strength 4+/5 to abduction.  5/5 external rotation, 5/5 internal rotation. Negative Hawkins and Neer's test negative Yergason's and speeds and O'Brien test.  Pulses cap refill and sensation intact bilateral upper extremities.  Lab and Radiology Results X-ray images left shoulder obtained today personally dependently reviewed Intact hardware from rotator cuff repair present in humeral head.  No acute fractures.  Humeral head appears to be aligned.  Minimal degenerative changes. Await formal radiology review   Assessment and Plan: 53 y.o. male with left shoulder pain.  Pain occurred acutely about 3 weeks ago.  He said significant mechanical symptoms since associated with significant weakness.  I am very concerned for either full-thickness rotator cuff repair or posterior labrum tear or both.  Plan for MRI arthrogram in the near future to further characterize injury and for potential surgical or injection planning.  Recheck after MRI.  Precautions reviewed.  PDMP not reviewed this encounter. Orders Placed This Encounter  Procedures  . DG Shoulder Left    Standing Status:   Future    Standing Expiration Date:   05/02/2020    Order Specific Question:   Reason for Exam (SYMPTOM  OR DIAGNOSIS REQUIRED)    Answer:   eval left shoudler pain x 3 weeks    Order Specific Question:   Preferred imaging location?    Answer[facility]    Order Specific Question:   Radiology Contrast Protocol - do NOT remove file  path    Answer:   \\[email or web address]\Radiant\DXFluoroContrastProtocols.pdf  . MR SHOULDER LEFT W CONTRAST    MRI arthrogram left shoulder    Standing Status:   Future    Standing Expiration Date:   05/02/2020    Order Specific Question:   ** REASON FOR EXAM (FREE TEXT)    Answer:   Left shoulder injury with mechanical symptoms concerning labrum tear and possible infraspinatus tendon tear.  Plan  MRI arthrogram    Order Specific Question:   If indicated for the ordered procedure, I authorize the administration of contrast media per Radiology protocol    Answer:   Yes    Order Specific Question:   What is the patient's sedation requirement?    Answer:   No Sedation    Order Specific Question:   Does the patient have a pacemaker or implanted devices?    Answer:   No    Order Specific Question:   Radiology Contrast Protocol - do NOT remove file path    Answer:   \\[email or web address]\epicdata\Radiant\[email or web address].PDF    Order Specific Question:   Preferred imaging location?    Answer:   Product/process development scientist (table limit-350lbs)   No orders of the defined types were placed in this encounter.    Historical information moved to improve visibility of documentation.  Past Medical History:  Diagnosis Date  . BPH (benign prostatic hyperplasia) 09/29/2015  . ED (erectile dysfunction) 09/29/2015  . HTN (hypertension) 09/29/2015  . Hyperlipidemia 09/29/2015  . Multiple dysplastic nevi   . Prurigo nodularis 08/22/2017   Injected left lateral lower leg [facility]   Past Surgical History:  Procedure Laterality Date  . Dysplastic nevi removal left scapular back April 2019 Left 10/01/2017   [facility]  . ROTATOR CUFF REPAIR Left 2012   Social History   Tobacco Use  . Smoking status: Never Smoker  . Smokeless tobacco: Never Used  Substance Use Topics  . Alcohol use: Yes   family history includes Cancer in his mother; Diabetes in his maternal aunt; Heart disease in his brother and father; Hypertension in his father.  Medications: Current Outpatient Medications  Medication Sig Dispense Refill  . lisinopril (ZESTRIL) 10 MG tablet Take 1 tablet (10 mg total) by mouth daily. 90 tablet 3  . sildenafil (VIAGRA) 100 MG tablet Take 0.5-1 tablets (50-100 mg total) by mouth daily as needed for erectile dysfunction. 30 tablet 11  . simvastatin (ZOCOR) 20 MG tablet Take  1 tablet (20 mg total) by mouth daily. 90 tablet 3  . tamsulosin (FLOMAX) 0.4 MG CAPS capsule Take 1 capsule (0.4 mg total) by mouth daily. 90 capsule 3   No current facility-administered medications for this visit.    No Known Allergies   Discussed warning signs or symptoms. Please see discharge instructions. Patient expresses understanding.

## 2019-03-09 ENCOUNTER — Telehealth: Payer: Self-pay | Admitting: Family Medicine

## 2019-03-09 NOTE — Telephone Encounter (Signed)
Pt needs premeds before MRI due to claustrophobia. Routing.

## 2019-03-10 ENCOUNTER — Other Ambulatory Visit: Payer: Managed Care, Other (non HMO)

## 2019-03-10 ENCOUNTER — Ambulatory Visit: Payer: Managed Care, Other (non HMO) | Admitting: Family Medicine

## 2019-03-10 MED ORDER — LORAZEPAM 0.5 MG PO TABS: ORAL_TABLET | ORAL | 0 refills | Status: DC

## 2019-03-10 NOTE — Telephone Encounter (Signed)
Ativan sent to [place].  Do not drive after taking ativan

## 2019-03-10 NOTE — Telephone Encounter (Signed)
Pt notified of recommendations -EH/RMA  

## 2019-03-16 ENCOUNTER — Ambulatory Visit: Payer: Managed Care, Other (non HMO) | Admitting: Family Medicine

## 2019-03-17 ENCOUNTER — Ambulatory Visit (INDEPENDENT_AMBULATORY_CARE_PROVIDER_SITE_OTHER): Payer: Managed Care, Other (non HMO) | Admitting: Family Medicine

## 2019-03-17 ENCOUNTER — Other Ambulatory Visit: Payer: Self-pay

## 2019-03-17 ENCOUNTER — Ambulatory Visit (INDEPENDENT_AMBULATORY_CARE_PROVIDER_SITE_OTHER): Payer: Managed Care, Other (non HMO)

## 2019-03-17 ENCOUNTER — Encounter: Payer: Self-pay | Admitting: Family Medicine

## 2019-03-17 VITALS — BP 123/77 | HR 85 | Temp 98.6°F | Wt 230.0 lb

## 2019-03-17 DIAGNOSIS — M25512 Pain in left shoulder: Secondary | ICD-10-CM

## 2019-03-17 NOTE — Progress Notes (Signed)
    Patient presents to clinic for  previously scheduled gadolinium interarticular contrast.  Procedure: Real-time Ultrasound Guided Injection of left GH joint.   Device: GE Logiq E  Images permanently stored and available for review in the ultrasound unit. Verbal informed consent obtained. Discussed risks and benefits of procedure. Warned about infection bleeding damage to structures skin hypopigmentation and fat atrophy among others. Patient expresses understanding and agreement Time-out conducted.  Noted no overlying erythema, induration, or other signs of local infection.  Skin prepped in a sterile fashion.  Local anesthesia: Topical Ethyl chloride.  With sterile technique and under real time ultrasound guidance:4 mL of Marcaine, 0.1 mL of gadolinium contrast, 5 mL sterile saline injected easily.  Completed without difficulty    Advised to call if fevers/chills, erythema, induration, drainage, or persistent bleeding.  Images permanently stored and available for review in the ultrasound unit.  Impression: Technically successful ultrasound guided injection.

## 2019-03-17 NOTE — Patient Instructions (Signed)
Thank you for coming in today. Call or go to the ER if you develop a large red swollen joint with extreme pain or oozing puss.  I will get results from MRI to you ASAP.

## 2019-03-18 ENCOUNTER — Telehealth: Payer: Self-pay | Admitting: Family Medicine

## 2019-03-18 DIAGNOSIS — M25512 Pain in left shoulder: Secondary | ICD-10-CM

## 2019-03-18 NOTE — Telephone Encounter (Signed)
Refer to orthopedic at [facility] with existing orthopedic doctor.  Patient should bring copy of CD of MRI with him.

## 2019-04-10 ENCOUNTER — Telehealth: Payer: Self-pay | Admitting: Family Medicine

## 2019-04-10 DIAGNOSIS — N401 Enlarged prostate with lower urinary tract symptoms: Secondary | ICD-10-CM

## 2019-04-10 DIAGNOSIS — R350 Frequency of micturition: Secondary | ICD-10-CM

## 2019-04-10 NOTE — Telephone Encounter (Signed)
Patient advised of recommendations.  

## 2019-04-10 NOTE — Telephone Encounter (Signed)
-----   Message from Gregor Hams, MD sent at 11/24/2018  9:00 AM EDT ----- Regarding: Recheck PSA Recheck PSA.  1 year follow-up.

## 2019-04-10 NOTE — Telephone Encounter (Signed)
We planed to check PSA on or after October 22 as it has now been 1 year since your last lab test for this.  PSA blood test ordered and should be done soon.  Does not need to be fasting.

## 2019-05-07 LAB — PSA: PSA: 2.1 ng/mL (ref <=4.0)

## 2019-07-07 DIAGNOSIS — Z01812 Encounter for preprocedural laboratory examination: Secondary | ICD-10-CM | POA: Diagnosis not present

## 2019-07-07 DIAGNOSIS — Z20822 Contact with and (suspected) exposure to covid-19: Secondary | ICD-10-CM | POA: Diagnosis not present

## 2019-07-07 DIAGNOSIS — M75122 Complete rotator cuff tear or rupture of left shoulder, not specified as traumatic: Secondary | ICD-10-CM | POA: Diagnosis not present

## 2019-07-10 DIAGNOSIS — S46112A Strain of muscle, fascia and tendon of long head of biceps, left arm, initial encounter: Secondary | ICD-10-CM | POA: Diagnosis not present

## 2019-07-10 DIAGNOSIS — M7542 Impingement syndrome of left shoulder: Secondary | ICD-10-CM | POA: Diagnosis not present

## 2019-07-10 DIAGNOSIS — M75122 Complete rotator cuff tear or rupture of left shoulder, not specified as traumatic: Secondary | ICD-10-CM | POA: Diagnosis not present

## 2019-07-10 DIAGNOSIS — M12811 Other specific arthropathies, not elsewhere classified, right shoulder: Secondary | ICD-10-CM | POA: Diagnosis not present

## 2019-07-10 DIAGNOSIS — G8918 Other acute postprocedural pain: Secondary | ICD-10-CM | POA: Diagnosis not present

## 2019-07-10 DIAGNOSIS — Y93B3 Activity, free weights: Secondary | ICD-10-CM | POA: Diagnosis not present

## 2019-07-10 DIAGNOSIS — M12812 Other specific arthropathies, not elsewhere classified, left shoulder: Secondary | ICD-10-CM | POA: Diagnosis not present

## 2019-07-10 DIAGNOSIS — S40252A Superficial foreign body of left shoulder, initial encounter: Secondary | ICD-10-CM | POA: Diagnosis not present

## 2019-07-10 DIAGNOSIS — M25812 Other specified joint disorders, left shoulder: Secondary | ICD-10-CM | POA: Diagnosis not present

## 2019-07-10 DIAGNOSIS — M75101 Unspecified rotator cuff tear or rupture of right shoulder, not specified as traumatic: Secondary | ICD-10-CM | POA: Diagnosis not present

## 2019-07-10 DIAGNOSIS — M75102 Unspecified rotator cuff tear or rupture of left shoulder, not specified as traumatic: Secondary | ICD-10-CM | POA: Diagnosis not present

## 2019-08-03 DIAGNOSIS — Z4789 Encounter for other orthopedic aftercare: Secondary | ICD-10-CM | POA: Diagnosis not present

## 2019-08-05 DIAGNOSIS — Z4789 Encounter for other orthopedic aftercare: Secondary | ICD-10-CM | POA: Diagnosis not present

## 2019-08-12 DIAGNOSIS — Z4789 Encounter for other orthopedic aftercare: Secondary | ICD-10-CM | POA: Diagnosis not present

## 2019-08-17 DIAGNOSIS — Z4789 Encounter for other orthopedic aftercare: Secondary | ICD-10-CM | POA: Diagnosis not present

## 2019-08-19 DIAGNOSIS — Z4789 Encounter for other orthopedic aftercare: Secondary | ICD-10-CM | POA: Diagnosis not present

## 2019-08-20 DIAGNOSIS — M75101 Unspecified rotator cuff tear or rupture of right shoulder, not specified as traumatic: Secondary | ICD-10-CM | POA: Diagnosis not present

## 2019-08-20 DIAGNOSIS — M19012 Primary osteoarthritis, left shoulder: Secondary | ICD-10-CM | POA: Diagnosis not present

## 2019-08-20 DIAGNOSIS — Z9889 Other specified postprocedural states: Secondary | ICD-10-CM | POA: Diagnosis not present

## 2019-08-20 DIAGNOSIS — Z9689 Presence of other specified functional implants: Secondary | ICD-10-CM | POA: Diagnosis not present

## 2019-08-20 DIAGNOSIS — M75122 Complete rotator cuff tear or rupture of left shoulder, not specified as traumatic: Secondary | ICD-10-CM | POA: Diagnosis not present

## 2019-08-26 DIAGNOSIS — Z4789 Encounter for other orthopedic aftercare: Secondary | ICD-10-CM | POA: Diagnosis not present

## 2019-08-27 DIAGNOSIS — Z4789 Encounter for other orthopedic aftercare: Secondary | ICD-10-CM | POA: Diagnosis not present

## 2019-09-05 ENCOUNTER — Ambulatory Visit: Payer: Self-pay | Attending: Internal Medicine

## 2019-09-05 DIAGNOSIS — Z23 Encounter for immunization: Secondary | ICD-10-CM

## 2019-09-05 NOTE — Progress Notes (Signed)
   Covid-19 Vaccination Clinic  Name:  Anthony Lawson    MRN: [id number] DOB: 1966-06-05  09/05/2019  [name] was observed post Covid-19 immunization for 15 minutes without incident. He was provided with Vaccine Information Sheet and instruction to access the V-Safe system.   [name] was instructed to call 911 with any severe reactions post vaccine: Marland Kitchen Difficulty breathing  . Swelling of face and throat  . A fast heartbeat  . A bad rash all over body  . Dizziness and weakness   Immunizations Administered    Name Date Dose VIS Date Route   Pfizer COVID-19 Vaccine 09/05/2019  9:41 AM 0.3 mL 05/29/2019 Intramuscular   Manufacturer: [place]   Lot: [id number]   [place]: [id number]

## 2019-09-07 DIAGNOSIS — Z4789 Encounter for other orthopedic aftercare: Secondary | ICD-10-CM | POA: Diagnosis not present

## 2019-09-09 DIAGNOSIS — Z4789 Encounter for other orthopedic aftercare: Secondary | ICD-10-CM | POA: Diagnosis not present

## 2019-09-21 DIAGNOSIS — Z4789 Encounter for other orthopedic aftercare: Secondary | ICD-10-CM | POA: Diagnosis not present

## 2019-09-23 DIAGNOSIS — Z4789 Encounter for other orthopedic aftercare: Secondary | ICD-10-CM | POA: Diagnosis not present

## 2019-09-29 ENCOUNTER — Ambulatory Visit: Payer: Self-pay | Attending: Internal Medicine

## 2019-09-29 DIAGNOSIS — Z23 Encounter for immunization: Secondary | ICD-10-CM

## 2019-09-29 NOTE — Progress Notes (Signed)
   Covid-19 Vaccination Clinic  Name:  Anthony Lawson    MRN: [id number] DOB: [birth year]  09/29/2019  [name] was observed post Covid-19 immunization for 15 minutes without incident. He was provided with Vaccine Information Sheet and instruction to access the V-Safe system.   [name] was instructed to call 911 with any severe reactions post vaccine: Marland Kitchen Difficulty breathing  . Swelling of face and throat  . A fast heartbeat  . A bad rash all over body  . Dizziness and weakness   Immunizations Administered    Name Date Dose VIS Date Route   Pfizer COVID-19 Vaccine 09/29/2019  4:03 PM 0.3 mL 05/29/2019 Intramuscular   Manufacturer: [place]   Lot: [id number]   [place]: [id number]

## 2019-09-30 DIAGNOSIS — Z4789 Encounter for other orthopedic aftercare: Secondary | ICD-10-CM | POA: Diagnosis not present

## 2019-10-05 DIAGNOSIS — M75101 Unspecified rotator cuff tear or rupture of right shoulder, not specified as traumatic: Secondary | ICD-10-CM | POA: Diagnosis not present

## 2019-10-05 DIAGNOSIS — M12811 Other specific arthropathies, not elsewhere classified, right shoulder: Secondary | ICD-10-CM | POA: Diagnosis not present

## 2019-10-05 DIAGNOSIS — M75121 Complete rotator cuff tear or rupture of right shoulder, not specified as traumatic: Secondary | ICD-10-CM | POA: Diagnosis not present

## 2019-10-05 DIAGNOSIS — M25512 Pain in left shoulder: Secondary | ICD-10-CM | POA: Diagnosis not present

## 2019-10-05 DIAGNOSIS — M19012 Primary osteoarthritis, left shoulder: Secondary | ICD-10-CM | POA: Diagnosis not present

## 2019-10-07 DIAGNOSIS — Z4789 Encounter for other orthopedic aftercare: Secondary | ICD-10-CM | POA: Diagnosis not present

## 2019-10-14 DIAGNOSIS — Z4789 Encounter for other orthopedic aftercare: Secondary | ICD-10-CM | POA: Diagnosis not present

## 2019-10-19 DIAGNOSIS — Z4789 Encounter for other orthopedic aftercare: Secondary | ICD-10-CM | POA: Diagnosis not present

## 2019-10-26 DIAGNOSIS — Z4789 Encounter for other orthopedic aftercare: Secondary | ICD-10-CM | POA: Diagnosis not present

## 2020-01-04 DIAGNOSIS — S46012D Strain of muscle(s) and tendon(s) of the rotator cuff of left shoulder, subsequent encounter: Secondary | ICD-10-CM | POA: Diagnosis not present

## 2020-01-04 DIAGNOSIS — M75102 Unspecified rotator cuff tear or rupture of left shoulder, not specified as traumatic: Secondary | ICD-10-CM | POA: Diagnosis not present

## 2020-01-04 DIAGNOSIS — M75122 Complete rotator cuff tear or rupture of left shoulder, not specified as traumatic: Secondary | ICD-10-CM | POA: Diagnosis not present

## 2020-01-04 DIAGNOSIS — M12812 Other specific arthropathies, not elsewhere classified, left shoulder: Secondary | ICD-10-CM | POA: Diagnosis not present

## 2020-02-03 ENCOUNTER — Other Ambulatory Visit: Payer: Self-pay | Admitting: Family Medicine

## 2020-02-03 DIAGNOSIS — E782 Mixed hyperlipidemia: Secondary | ICD-10-CM

## 2020-02-03 DIAGNOSIS — N401 Enlarged prostate with lower urinary tract symptoms: Secondary | ICD-10-CM

## 2020-02-03 DIAGNOSIS — I10 Essential (primary) hypertension: Secondary | ICD-10-CM

## 2020-02-12 ENCOUNTER — Encounter: Payer: Self-pay | Admitting: Family Medicine

## 2020-02-12 ENCOUNTER — Ambulatory Visit (INDEPENDENT_AMBULATORY_CARE_PROVIDER_SITE_OTHER): Payer: BC Managed Care – PPO | Admitting: Family Medicine

## 2020-02-12 ENCOUNTER — Other Ambulatory Visit: Payer: Self-pay

## 2020-02-12 VITALS — BP 133/85 | HR 71 | Temp 97.7°F | Ht 72.54 in | Wt 232.7 lb

## 2020-02-12 DIAGNOSIS — N401 Enlarged prostate with lower urinary tract symptoms: Secondary | ICD-10-CM

## 2020-02-12 DIAGNOSIS — R7989 Other specified abnormal findings of blood chemistry: Secondary | ICD-10-CM

## 2020-02-12 DIAGNOSIS — R3912 Poor urinary stream: Secondary | ICD-10-CM

## 2020-02-12 DIAGNOSIS — R350 Frequency of micturition: Secondary | ICD-10-CM

## 2020-02-12 DIAGNOSIS — I10 Essential (primary) hypertension: Secondary | ICD-10-CM | POA: Diagnosis not present

## 2020-02-12 DIAGNOSIS — Z0000 Encounter for general adult medical examination without abnormal findings: Secondary | ICD-10-CM | POA: Insufficient documentation

## 2020-02-12 DIAGNOSIS — E782 Mixed hyperlipidemia: Secondary | ICD-10-CM

## 2020-02-12 MED ORDER — TAMSULOSIN HCL 0.4 MG PO CAPS: 0.4000 mg | ORAL_CAPSULE | Freq: Every day | ORAL | 3 refills | Status: DC

## 2020-02-12 MED ORDER — SIMVASTATIN 20 MG PO TABS: 20.0000 mg | ORAL_TABLET | Freq: Every day | ORAL | 3 refills | Status: DC

## 2020-02-12 MED ORDER — LISINOPRIL 10 MG PO TABS: 10.0000 mg | ORAL_TABLET | Freq: Every day | ORAL | 3 refills | Status: DC

## 2020-02-12 NOTE — Progress Notes (Signed)
Sent fax request to [facility] for eye exam.

## 2020-02-12 NOTE — Patient Instructions (Signed)
Preventive Care 41-54 Years Old, Male Preventive care refers to lifestyle choices and visits with your health care provider that can promote health and wellness. This includes:  A yearly physical exam. This is also called an annual well check.  Regular dental and eye exams.  Immunizations.  Screening for certain conditions.  Healthy lifestyle choices, such as eating a healthy diet, getting regular exercise, not using drugs or products that contain nicotine and tobacco, and limiting alcohol use. What can I expect for my preventive care visit? Physical exam Your health care provider will check:  Height and weight. These may be used to calculate body mass index (BMI), which is a measurement that tells if you are at a healthy weight.  Heart rate and blood pressure.  Your skin for abnormal spots. Counseling Your health care provider may ask you questions about:  Alcohol, tobacco, and drug use.  Emotional well-being.  Home and relationship well-being.  Sexual activity.  Eating habits.  Work and work Statistician. What immunizations do I need?  Influenza (flu) vaccine  This is recommended every year. Tetanus, diphtheria, and pertussis (Tdap) vaccine  You may need a Td booster every 10 years. Varicella (chickenpox) vaccine  You may need this vaccine if you have not already been vaccinated. Zoster (shingles) vaccine  You may need this after age 64. Measles, mumps, and rubella (MMR) vaccine  You may need at least one dose of MMR if you were born in 1957 or later. You may also need a second dose. Pneumococcal conjugate (PCV13) vaccine  You may need this if you have certain conditions and were not previously vaccinated. Pneumococcal polysaccharide (PPSV23) vaccine  You may need one or two doses if you smoke cigarettes or if you have certain conditions. Meningococcal conjugate (MenACWY) vaccine  You may need this if you have certain conditions. Hepatitis A  vaccine  You may need this if you have certain conditions or if you travel or work in places where you may be exposed to hepatitis A. Hepatitis B vaccine  You may need this if you have certain conditions or if you travel or work in places where you may be exposed to hepatitis B. Haemophilus influenzae type b (Hib) vaccine  You may need this if you have certain risk factors. Human papillomavirus (HPV) vaccine  If recommended by your health care provider, you may need three doses over 6 months. You may receive vaccines as individual doses or as more than one vaccine together in one shot (combination vaccines). Talk with your health care provider about the risks and benefits of combination vaccines. What tests do I need? Blood tests  Lipid and cholesterol levels. These may be checked every 5 years, or more frequently if you are over 60 years old.  Hepatitis C test.  Hepatitis B test. Screening  Lung cancer screening. You may have this screening every year starting at age 43 if you have a 30-pack-year history of smoking and currently smoke or have quit within the past 15 years.  Prostate cancer screening. Recommendations will vary depending on your family history and other risks.  Colorectal cancer screening. All adults should have this screening starting at age 72 and continuing until age 2. Your health care provider may recommend screening at age 14 if you are at increased risk. You will have tests every 1-10 years, depending on your results and the type of screening test.  Diabetes screening. This is done by checking your blood sugar (glucose) after you have not eaten  for a while (fasting). You may have this done every 1-3 years.  Sexually transmitted disease (STD) testing. Follow these instructions at home: Eating and drinking  Eat a diet that includes fresh fruits and vegetables, whole grains, lean protein, and low-fat dairy products.  Take vitamin and mineral supplements as  recommended by your health care provider.  Do not drink alcohol if your health care provider tells you not to drink.  If you drink alcohol: ? Limit how much you have to 0-2 drinks a day. ? Be aware of how much alcohol is in your drink. In the [place] one drink equals one 12 oz bottle of beer (355 mL), one 5 oz glass of wine (148 mL), or one 1 oz glass of hard liquor (44 mL). Lifestyle  Take daily care of your teeth and gums.  Stay active. Exercise for at least 30 minutes on 5 or more days each week.  Do not use any products that contain nicotine or tobacco, such as cigarettes, e-cigarettes, and chewing tobacco. If you need help quitting, ask your health care provider.  If you are sexually active, practice safe sex. Use a condom or other form of protection to prevent STIs (sexually transmitted infections).  Talk with your health care provider about taking a low-dose aspirin every day starting at age 53. What's next?  Go to your health care provider once a year for a well check visit.  Ask your health care provider how often you should have your eyes and teeth checked.  Stay up to date on all vaccines. This information is not intended to replace advice given to you by your health care provider. Make sure you discuss any questions you have with your health care provider. Document Revised: 05/29/2018 Document Reviewed: 05/29/2018 Elsevier Patient Education  2020 [employer].

## 2020-02-12 NOTE — Assessment & Plan Note (Addendum)
Well adult.  Chronic medical conditions have been well controlled with current medications. Recommend continuation.  Orders Placed This Encounter  Procedures  . COMPLETE METABOLIC PANEL WITH GFR  . CBC  . Lipid Profile  . TSH  . PSA  . Testosterone  Screening: PSA.  He has colonoscopy scheduled.  Immunizations:  UTD Anticipatory guidance/Risk factor reduction:  Recommendations per AVS.

## 2020-02-12 NOTE — Progress Notes (Signed)
Anthony Lawson - 54 y.o. male MRN [id number]  Date of birth: May 15, 1966  Subjective Chief Complaint  Patient presents with  . Annual Exam    HPI Anthony Lawson is a 54 y.o. male with history of HTN, HLD and BPH.  He is here today for annual exam.  He reports that he is doing well and has no additional complaints or concerns.  His BP has remained well controlled with lisinopril.    He is tolerating simvastatin well wihout myalgias.   He is a non-smoker.  He does exercise regularly and feels like diet is pretty good.    He has a few servings of EtOH each week.   Review of Systems  Constitutional: Negative for chills, fever, malaise/fatigue and weight loss.  HENT: Negative for congestion, ear pain and sore throat.   Eyes: Negative for blurred vision, double vision and pain.  Respiratory: Negative for cough and shortness of breath.   Cardiovascular: Negative for chest pain and palpitations.  Gastrointestinal: Negative for abdominal pain, blood in stool, constipation, heartburn and nausea.  Genitourinary: Negative for dysuria and urgency.  Musculoskeletal: Negative for joint pain and myalgias.  Neurological: Negative for dizziness and headaches.  Endo/Heme/Allergies: Does not bruise/bleed easily.  Psychiatric/Behavioral: Negative for depression. The patient is not nervous/anxious and does not have insomnia.       No Known Allergies  Past Medical History:  Diagnosis Date  . BPH (benign prostatic hyperplasia) 09/29/2015  . ED (erectile dysfunction) 09/29/2015  . HTN (hypertension) 09/29/2015  . Hyperlipidemia 09/29/2015  . Multiple dysplastic nevi   . Prurigo nodularis 08/22/2017   Injected left lateral lower leg [facility]    Past Surgical History:  Procedure Laterality Date  . Dysplastic nevi removal left scapular back April 2019 Left 10/01/2017   [facility]  . ROTATOR CUFF REPAIR Left 2012    Social History   Socioeconomic  History  . Marital status: Married    Spouse name: Not on file  . Number of children: Not on file  . Years of education: Not on file  . Highest education level: Not on file  Occupational History  . Not on file  Tobacco Use  . Smoking status: Never Smoker  . Smokeless tobacco: Never Used  Vaping Use  . Vaping Use: Never used  Substance and Sexual Activity  . Alcohol use: Yes  . Drug use: No  . Sexual activity: Yes  Other Topics Concern  . Not on file  Social History Narrative  . Not on file   Social Determinants of Health   Financial Resource Strain:   . Difficulty of Paying Living Expenses: Not on file  Food Insecurity:   . Worried About Charity fundraiser in the Last Year: Not on file  . Ran Out of Food in the Last Year: Not on file  Transportation Needs:   . Lack of Transportation (Medical): Not on file  . Lack of Transportation (Non-Medical): Not on file  Physical Activity:   . Days of Exercise per Week: Not on file  . Minutes of Exercise per Session: Not on file  Stress:   . Feeling of Stress : Not on file  Social Connections:   . Frequency of Communication with Friends and Family: Not on file  . Frequency of Social Gatherings with Friends and Family: Not on file  . Attends Religious Services: Not on file  . Active Member of Clubs or Organizations: Not on file  . Attends  Club or Organization Meetings: Not on file  . Marital Status: Not on file    Family History  Problem Relation Age of Onset  . Cancer Mother   . Heart disease Father   . Hypertension Father   . Heart disease Brother   . Diabetes Maternal Aunt     Health Maintenance  Topic Date Due  . Hepatitis C Screening  Never done  . HIV Screening  Never done  . COLONOSCOPY  11/14/2017  . INFLUENZA VACCINE  01/17/2020  . TETANUS/TDAP  12/03/2022  . COVID-19 Vaccine  Completed      ----------------------------------------------------------------------------------------------------------------------------------------------------------------------------------------------------------------- Physical Exam BP 133/85 (BP Location: Left Arm, Patient Position: Sitting, Cuff Size: Normal)   Pulse 71   Temp 97.7 F (36.5 C) (Temporal)   Ht 6' 0.54" (1.843 m)   Wt 232 lb 11.2 oz (105.6 kg)   SpO2 98%   BMI 31.09 kg/m   Physical Exam Constitutional:      General: He is not in acute distress.    Appearance: Normal appearance.  HENT:     Head: Normocephalic and atraumatic.     Right Ear: External ear normal.     Left Ear: External ear normal.  Eyes:     General: No scleral icterus. Neck:     Thyroid: No thyromegaly.  Cardiovascular:     Rate and Rhythm: Normal rate and regular rhythm.     Heart sounds: Normal heart sounds.  Pulmonary:     Effort: Pulmonary effort is normal.     Breath sounds: Normal breath sounds.  Abdominal:     General: Bowel sounds are normal. There is no distension.     Palpations: Abdomen is soft.     Tenderness: There is no abdominal tenderness. There is no guarding.  Musculoskeletal:     Cervical back: Normal range of motion.  Lymphadenopathy:     Cervical: No cervical adenopathy.  Skin:    General: Skin is warm and dry.     Findings: No rash.  Neurological:     General: No focal deficit present.     Mental Status: He is alert and oriented to person, place, and time.     Cranial Nerves: No cranial nerve deficit.     Motor: No abnormal muscle tone.  Psychiatric:        Mood and Affect: Mood normal.        Behavior: Behavior normal.     ------------------------------------------------------------------------------------------------------------------------------------------------------------------------------------------------------------------- Assessment and Plan  Well adult exam Well adult.  Chronic medical conditions  have been well controlled with current medications. Recommend continuation.  Orders Placed This Encounter  Procedures  . COMPLETE METABOLIC PANEL WITH GFR  . CBC  . Lipid Profile  . TSH  . PSA  . Testosterone  Screening: PSA.  He has colonoscopy scheduled.  Immunizations:  UTD Anticipatory guidance/Risk factor reduction:  Recommendations per AVS.     Meds ordered this encounter  Medications  . lisinopril (ZESTRIL) 10 MG tablet    Sig: Take 1 tablet (10 mg total) by mouth daily.    Dispense:  90 tablet    Refill:  3  . simvastatin (ZOCOR) 20 MG tablet    Sig: Take 1 tablet (20 mg total) by mouth daily.    Dispense:  90 tablet    Refill:  3  . tamsulosin (FLOMAX) 0.4 MG CAPS capsule    Sig: Take 1 capsule (0.4 mg total) by mouth daily.    Dispense:  90 capsule  Refill:  3    No follow-ups on file.    This visit occurred during the SARS-CoV-2 public health emergency.  Safety protocols were in place, including screening questions prior to the visit, additional usage of staff PPE, and extensive cleaning of exam room while observing appropriate contact time as indicated for disinfecting solutions.

## 2020-02-13 LAB — CBC
HCT: 45.7 % (ref 38.5–50.0)
Hemoglobin: 15.8 g/dL (ref 13.2–17.1)
MCH: 30.7 pg (ref 27.0–33.0)
MCHC: 34.6 g/dL (ref 32.0–36.0)
MCV: 88.9 fL (ref 80.0–100.0)
MPV: 10.1 fL (ref 7.5–12.5)
Platelets: 240 10*3/uL (ref 140–400)
RBC: 5.14 10*6/uL (ref 4.20–5.80)
RDW: 12.6 % (ref 11.0–15.0)
WBC: 5.8 10*3/uL (ref 3.8–10.8)

## 2020-02-13 LAB — COMPLETE METABOLIC PANEL WITH GFR
AG Ratio: 2 (calc) (ref 1.0–2.5)
ALT: 27 U/L (ref 9–46)
AST: 18 U/L (ref 10–35)
Albumin: 4.6 g/dL (ref 3.6–5.1)
Alkaline phosphatase (APISO): 66 U/L (ref 35–144)
BUN: 16 mg/dL (ref 7–25)
CO2: 26 mmol/L (ref 20–32)
Calcium: 9.9 mg/dL (ref 8.6–10.3)
Chloride: 106 mmol/L (ref 98–110)
Creat: 0.85 mg/dL (ref 0.70–1.33)
GFR, Est African American: 114 mL/min/{1.73_m2} (ref >=60)
GFR, Est Non African American: 99 mL/min/{1.73_m2} (ref >=60)
Globulin: 2.3 g/dL (calc) (ref 1.9–3.7)
Glucose, Bld: 94 mg/dL (ref 65–99)
Potassium: 4.3 mmol/L (ref 3.5–5.3)
Sodium: 141 mmol/L (ref 135–146)
Total Bilirubin: 0.5 mg/dL (ref 0.2–1.2)
Total Protein: 6.9 g/dL (ref 6.1–8.1)

## 2020-02-13 LAB — LIPID PANEL
Cholesterol: 157 mg/dL (ref <200)
HDL: 45 mg/dL (ref >=40)
LDL Cholesterol (Calc): 88 mg/dL (calc)
Non-HDL Cholesterol (Calc): 112 mg/dL (calc) (ref <130)
Total CHOL/HDL Ratio: 3.5 (calc) (ref <5.0)
Triglycerides: 140 mg/dL (ref <150)

## 2020-02-13 LAB — TSH: TSH: 1.86 mIU/L (ref 0.40–4.50)

## 2020-02-13 LAB — PSA: PSA: 1.5 ng/mL (ref <=4.0)

## 2020-02-17 ENCOUNTER — Encounter: Payer: Self-pay | Admitting: Family Medicine

## 2020-02-18 NOTE — Progress Notes (Signed)
Testosterone has still not resulted.  Can we check status of this lab result?   Thanks!

## 2020-04-11 ENCOUNTER — Ambulatory Visit: Payer: BC Managed Care – PPO | Admitting: Internal Medicine

## 2020-04-11 ENCOUNTER — Encounter: Payer: Self-pay | Admitting: Internal Medicine

## 2020-04-11 VITALS — BP 120/90 | HR 84 | Ht 71.65 in | Wt 241.1 lb

## 2020-04-11 DIAGNOSIS — Z86010 Personal history of colonic polyps: Secondary | ICD-10-CM | POA: Diagnosis not present

## 2020-04-11 DIAGNOSIS — Z1211 Encounter for screening for malignant neoplasm of colon: Secondary | ICD-10-CM | POA: Diagnosis not present

## 2020-04-11 MED ORDER — SUTAB 1479-225-188 MG PO TABS: 1.0000 | ORAL_TABLET | Freq: Once | ORAL | 0 refills | Status: AC

## 2020-04-11 NOTE — Patient Instructions (Signed)
You have been scheduled for a colonoscopy. Please follow written instructions given to you at your visit today.  Please pick up your prep supplies at the pharmacy within the next 1-3 days. If you use inhalers (even only as needed), please bring them with you on the day of your procedure.   

## 2020-04-11 NOTE — Progress Notes (Signed)
HISTORY OF PRESENT ILLNESS:  Anthony Lawson is a 54 y.o. 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  Patient reports having had colonoscopy in [place] on 2 occasions.  Have a copy of his most recent report dated Nov 14, 2012.  The stated indication was increased risk due to family history of colon polyps.  Examination was normal.  Follow-up in 5 years recommended.  Patient denies family history of colon cancer.  Questionable history of colon polyps.  He thinks he may have had colon polyps himself on his index exam.  No details.  His GI review of systems is negative except for intermittent problems with hemorrhoids.  Review of outside blood work from February 12, 2020 shows unremarkable comprehensive metabolic panel and CBC.  Hemoglobin 15.8.  He has completed his Covid vaccination series  REVIEW OF SYSTEMS:  All non-GI ROS negative unless otherwise stated in the HPI except for sinus and allergy trouble  Past Medical History:  Diagnosis Date  . BPH (benign prostatic hyperplasia) 09/29/2015  . ED (erectile dysfunction) 09/29/2015  . HTN (hypertension) 09/29/2015  . Hyperlipidemia 09/29/2015  . Multiple dysplastic nevi   . Prurigo nodularis 08/22/2017   Injected left lateral lower leg [facility]    Past Surgical History:  Procedure Laterality Date  . Dysplastic nevi removal left scapular back April 2019 Left 10/01/2017   [facility]  . ROTATOR CUFF REPAIR Left 2012   x 2    Social History [facility]  reports that he has never smoked. He has never used smokeless tobacco. He reports current alcohol use. He reports that he does not use drugs.  family history includes Bone cancer in his mother; Breast cancer in his mother; Diabetes in his maternal aunt and paternal aunt; Heart disease in his brother, father, paternal aunt, and paternal uncle; Hypertension in his  father, paternal aunt, and paternal uncle; Lung cancer in his maternal grandfather and maternal grandmother.  No Known Allergies     PHYSICAL EXAMINATION: Vital signs: BP 120/90 (BP Location: Left Arm, Patient Position: Sitting, Cuff Size: Normal)   Pulse 84   Ht 5' 11.65" (1.82 m) Comment: height measured without shoes  Wt 241 lb 2 oz (109.4 kg)   BMI 33.02 kg/m   Constitutional: generally well-appearing, no acute distress Psychiatric: alert and oriented x3, cooperative Eyes: extraocular movements intact, anicteric, conjunctiva pink Mouth: oral pharynx moist, no lesions Neck: supple no lymphadenopathy Cardiovascular: heart regular rate and rhythm, no murmur Lungs: clear to auscultation bilaterally Abdomen: soft, nontender, nondistended, no obvious ascites, no peritoneal signs, normal bowel sounds, no organomegaly Rectal: Deferred until colonoscopy Extremities: no clubbing, cyanosis, or lower extremity edema bilaterally Skin: no lesions on visible extremities Neuro: No focal deficits.  Cranial nerves intact  ASSESSMENT:  1.  Reported personal history of colon polyps and positive family history of colon polyps.  2 prior colonoscopy examinations.  Last examination 2014 was normal.  Told to follow-up in 5 years.  Asymptomatic except for hemorrhoids   PLAN:  1.  I discussed with the patient that he is likely at baseline risk for colorectal neoplasia unless he had family members with adenomatous colon polyps less than 42 years old at the time of his index examination he may have had multiple or advanced adenomas.  We discussed the pros and cons of proceeding with colonoscopy at this time.  We mutually favor proceeding at this time given the lack of  certainty over the findings and subjective exam.  I also reviewed with him the current surveillance guidelines which were revised last year.The nature of the procedure, as well as the risks, benefits, and alternatives were carefully and  thoroughly reviewed with the patient. Ample time for discussion and questions allowed. The patient understood, was satisfied, and agreed to proceed.

## 2020-06-03 ENCOUNTER — Encounter: Payer: Self-pay | Admitting: Internal Medicine

## 2020-06-07 ENCOUNTER — Other Ambulatory Visit: Payer: Self-pay

## 2020-06-07 ENCOUNTER — Ambulatory Visit (AMBULATORY_SURGERY_CENTER): Payer: BC Managed Care – PPO | Admitting: Internal Medicine

## 2020-06-07 ENCOUNTER — Encounter: Payer: Self-pay | Admitting: Internal Medicine

## 2020-06-07 VITALS — BP 144/76 | HR 66 | Temp 97.7°F | Resp 18 | Ht 71.65 in | Wt 241.0 lb

## 2020-06-07 DIAGNOSIS — Z1211 Encounter for screening for malignant neoplasm of colon: Secondary | ICD-10-CM | POA: Diagnosis not present

## 2020-06-07 DIAGNOSIS — K635 Polyp of colon: Secondary | ICD-10-CM | POA: Diagnosis not present

## 2020-06-07 DIAGNOSIS — D124 Benign neoplasm of descending colon: Secondary | ICD-10-CM

## 2020-06-07 DIAGNOSIS — Z86010 Personal history of colonic polyps: Secondary | ICD-10-CM | POA: Diagnosis not present

## 2020-06-07 MED ORDER — SODIUM CHLORIDE 0.9 % IV SOLN
500.0000 mL | Freq: Once | INTRAVENOUS | Status: DC
Start: 2020-06-07 — End: 2020-12-28

## 2020-06-07 NOTE — Progress Notes (Signed)
Vs in adm by CW 

## 2020-06-07 NOTE — Progress Notes (Signed)
Called to room to assist during endoscopic procedure.  Patient ID and intended procedure confirmed with present staff. Received instructions for my participation in the procedure from the performing physician.  

## 2020-06-07 NOTE — Progress Notes (Signed)
A/ox3, pleased with MAC, report to RN 

## 2020-06-07 NOTE — Patient Instructions (Signed)
Thank you for allowing [place] to care for you today!  Await pathology result of polyp removed.  Will make recommendation at that time for next surveillance colonoscopy.  Resume previous diet and medications today.  Resume your normal daily activities tomorrow.    YOU HAD AN ENDOSCOPIC PROCEDURE TODAY AT [facility]:   Refer to the procedure report that was given to you for any specific questions about what was found during the examination.  If the procedure report does not answer your questions, please call your gastroenterologist to clarify.  If you requested that your care partner not be given the details of your procedure findings, then the procedure report has been included in a sealed envelope for you to review at your convenience later.  YOU SHOULD EXPECT: Some feelings of bloating in the abdomen. Passage of more gas than usual.  Walking can help get rid of the air that was put into your GI tract during the procedure and reduce the bloating. If you had a lower endoscopy (such as a colonoscopy or flexible sigmoidoscopy) you may notice spotting of blood in your stool or on the toilet paper. If you underwent a bowel prep for your procedure, you may not have a normal bowel movement for a few days.  Please Note:  You might notice some irritation and congestion in your nose or some drainage.  This is from the oxygen used during your procedure.  There is no need for concern and it should clear up in a day or so.  SYMPTOMS TO REPORT IMMEDIATELY:   Following lower endoscopy (colonoscopy or flexible sigmoidoscopy):  Excessive amounts of blood in the stool  Significant tenderness or worsening of abdominal pains  Swelling of the abdomen that is new, acute  Fever of 100F or higher     For urgent or emergent issues, a gastroenterologist can be reached at any hour by calling [phone]. Do not use MyChart messaging for urgent concerns.    DIET:  We do recommend a small meal at  first, but then you may proceed to your regular diet.  Drink plenty of fluids but you should avoid alcoholic beverages for 24 hours.  ACTIVITY:  You should plan to take it easy for the rest of today and you should NOT DRIVE or use heavy machinery until tomorrow (because of the sedation medicines used during the test).    FOLLOW UP: Our staff will call the number listed on your records 48-72 hours following your procedure to check on you and address any questions or concerns that you may have regarding the information given to you following your procedure. If we do not reach you, we will leave a message.  We will attempt to reach you two times.  During this call, we will ask if you have developed any symptoms of COVID 19. If you develop any symptoms (ie: fever, flu-like symptoms, shortness of breath, cough etc.) before then, please call [phone].  If you test positive for Covid 19 in the 2 weeks post procedure, please call and report this information to [place].    If any biopsies were taken you will be contacted by phone or by letter within the next 1-3 weeks.  Please call us at [phone] if you have not heard about the biopsies in 3 weeks.    SIGNATURES/CONFIDENTIALITY: You and/or your care partner have signed paperwork which will be entered into your electronic medical record.  These signatures attest to the fact that that the  information above on your After Visit Summary has been reviewed and is understood.  Full responsibility of the confidentiality of this discharge information lies with you and/or your care-partner.

## 2020-06-07 NOTE — Op Note (Signed)
[name] Procedure Date: 06/07/2020 1:37 PM MRN: [id number] Endoscopist: Docia Chuck. Anthony Lawson , MD Age: 54 Referring MD:  Date of Birth: April 20, 1966 Gender: Male Account #: [id number] Procedure:                Colonoscopy with cold snare polypectomy x 1 Indications:              Colon cancer screening in patient at increased                            risk: Family history of colon polyps in multiple                            1st-degree relatives. Reports 2 previous                            colonoscopies in [place]. Index 1 may have had                            polyps (no details). Last examination May 2014                            (reviewed) was normal Medicines:                Monitored Anesthesia Care Procedure:                Pre-Anesthesia Assessment:                           - Prior to the procedure, a History and Physical                            was performed, and patient medications and                            allergies were reviewed. The patient's tolerance of                            previous anesthesia was also reviewed. The risks                            and benefits of the procedure and the sedation                            options and risks were discussed with the patient.                            All questions were answered, and informed consent                            was obtained. Prior Anticoagulants: The patient has                            taken no previous anticoagulant or antiplatelet  agents. ASA Grade Assessment: II - A patient with                            mild systemic disease. After reviewing the risks                            and benefits, the patient was deemed in                            satisfactory condition to undergo the procedure.                           After obtaining informed consent, the colonoscope                            was passed under direct  vision. Throughout the                            procedure, the patient's blood pressure, pulse, and                            oxygen saturations were monitored continuously. The                            Olympus CF-HQ190L (Serial# [id number]) Colonoscope was                            introduced through the anus and advanced to the the                            cecum, identified by appendiceal orifice and                            ileocecal valve. The ileocecal valve, appendiceal                            orifice, and rectum were photographed. The quality                            of the bowel preparation was excellent. The                            colonoscopy was performed without difficulty. The                            patient tolerated the procedure well. The bowel                            preparation used was SUPREP via split dose                            instruction. Scope In: 1:46:49 PM Scope Out: 2:02:10 PM Scope Withdrawal Time: 0 hours 11 minutes 27 seconds  Total Procedure Duration: 0 hours 15  minutes 21 seconds  Findings:                 A 1 mm polyp was found in the descending colon. The                            polyp was sessile. The polyp was removed with a                            cold snare. Resection and retrieval were complete.                           Multiple diverticula were found in the colon.                           Internal hemorrhoids were found during retroflexion.                           The exam was otherwise without abnormality on                            direct and retroflexion views. Complications:            No immediate complications. Estimated blood loss:                            None. Estimated Blood Loss:     Estimated blood loss: none. Impression:               - One 1 mm polyp in the descending colon, removed                            with a cold snare. Resected and retrieved.                           - Diverticulosis.                            - Internal hemorrhoids.                           - The examination was otherwise normal on direct                            and retroflexion views. Recommendation:           - Repeat colonoscopy in 7-10 years for surveillance.                           - Patient has a contact number available for                            emergencies. The signs and symptoms of potential                            delayed complications were discussed with the  patient. Return to normal activities tomorrow.                            Written discharge instructions were provided to the                            patient.                           - Resume previous diet.                           - Continue present medications.                           - Await pathology results. Docia Chuck. Anthony Pastor, MD 06/07/2020 2:13:06 PM This report has been signed electronically.

## 2020-06-09 ENCOUNTER — Telehealth: Payer: Self-pay | Admitting: *Deleted

## 2020-06-09 NOTE — Telephone Encounter (Signed)
  Follow up Call-  Call back number 06/07/2020  Post procedure Call Back phone  # [phone]  Permission to leave phone message Yes  Some recent data might be hidden     Patient questions:  Do you have a fever, pain , or abdominal swelling? No. Pain Score  0 *  Have you tolerated food without any problems? Yes.    Have you been able to return to your normal activities? Yes.    Do you have any questions about your discharge instructions: Diet   No. Medications  No. Follow up visit  No.  Do you have questions or concerns about your Care? No.  Actions: * If pain score is 4 or above: No action needed, pain <4.  1. Have you developed a fever since your procedure? no  2.   Have you had an respiratory symptoms (SOB or cough) since your procedure? no  3.   Have you tested positive for COVID 19 since your procedure no  4.   Have you had any family members/close contacts diagnosed with the COVID 19 since your procedure?  no   If yes to any of these questions please route to Joylene John, RN and Joella Prince, RN

## 2020-06-21 ENCOUNTER — Encounter: Payer: Self-pay | Admitting: Internal Medicine

## 2020-07-07 DIAGNOSIS — M778 Other enthesopathies, not elsewhere classified: Secondary | ICD-10-CM | POA: Diagnosis not present

## 2020-07-07 DIAGNOSIS — M12811 Other specific arthropathies, not elsewhere classified, right shoulder: Secondary | ICD-10-CM | POA: Diagnosis not present

## 2020-07-07 DIAGNOSIS — M75101 Unspecified rotator cuff tear or rupture of right shoulder, not specified as traumatic: Secondary | ICD-10-CM | POA: Diagnosis not present

## 2020-07-07 DIAGNOSIS — M19012 Primary osteoarthritis, left shoulder: Secondary | ICD-10-CM | POA: Diagnosis not present

## 2020-07-07 DIAGNOSIS — Z9889 Other specified postprocedural states: Secondary | ICD-10-CM | POA: Diagnosis not present

## 2020-07-07 DIAGNOSIS — M7522 Bicipital tendinitis, left shoulder: Secondary | ICD-10-CM | POA: Diagnosis not present

## 2020-07-07 DIAGNOSIS — X58XXXS Exposure to other specified factors, sequela: Secondary | ICD-10-CM | POA: Diagnosis not present

## 2020-07-07 DIAGNOSIS — S46012D Strain of muscle(s) and tendon(s) of the rotator cuff of left shoulder, subsequent encounter: Secondary | ICD-10-CM | POA: Diagnosis not present

## 2020-07-07 DIAGNOSIS — M19011 Primary osteoarthritis, right shoulder: Secondary | ICD-10-CM | POA: Diagnosis not present

## 2020-07-07 DIAGNOSIS — S46012S Strain of muscle(s) and tendon(s) of the rotator cuff of left shoulder, sequela: Secondary | ICD-10-CM | POA: Diagnosis not present

## 2020-10-03 IMAGING — MR MR SHOULDER*L* W/ CM
5 series · 40 of 40 positions shown · IV contrast (agent unspecified)
Comparison: Left shoulder x-ray 03/03/2019

CLINICAL DATA: Left shoulder pain

EXAM:
MR ARTHROGRAM OF THE LEFT SHOULDER
TECHNIQUE: Multiplanar, multisequence MR imaging of the left shoulder was
performed following the administration of intra-articular contrast.
CONTRAST:  See Injection Documentation.

[Series 5: T1 fat-sat · axial · 4.0mm · 0.47mm/px · z∈[-30,+67]mm · 8 of 23 slices shown (1 of 3)]
[im 1/23]
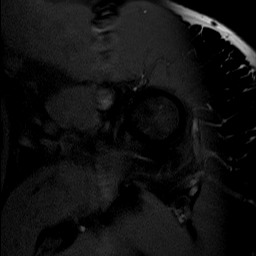
[im 4/23]
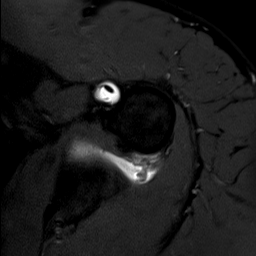
[im 7/23]
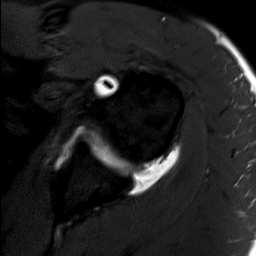
[im 10/23]
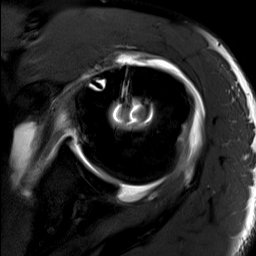
[im 13/23]
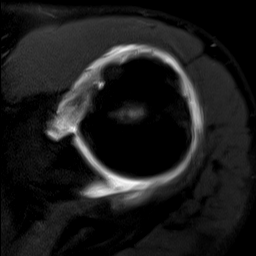
[im 16/23]
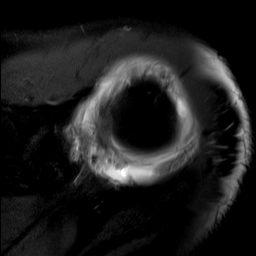
[im 19/23]
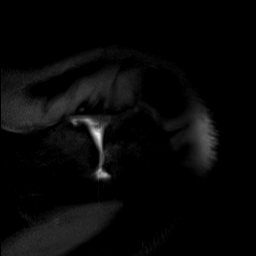
[im 23/23]
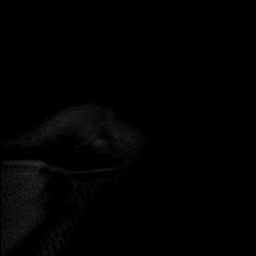

[Series 6: T1 fat-sat · oblique · 4.0mm · 0.55mm/px · 7 of 20 slices shown (2 of 3)]
[im 1/20]
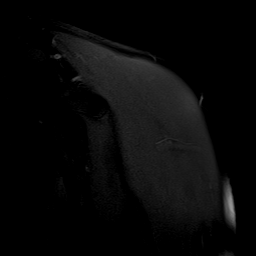
[im 4/20]
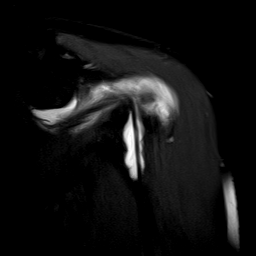
[im 7/20]
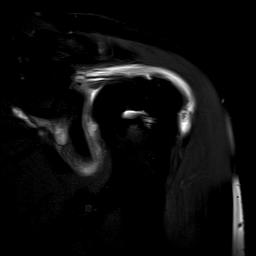
[im 10/20]
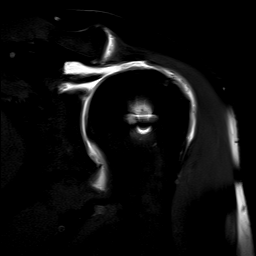
[im 13/20]
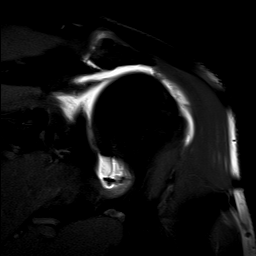
[im 16/20]
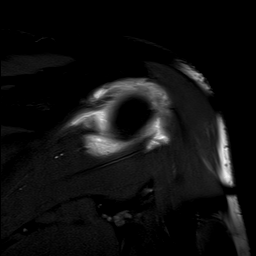
[im 20/20]
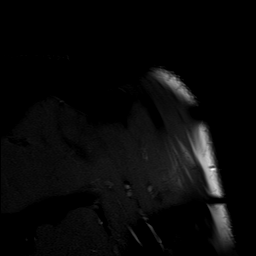

[Series 7: T2 fat-sat · oblique · 4.0mm · 0.55mm/px · 8 of 20 slices shown (1 of 2)]
[im 1/20]
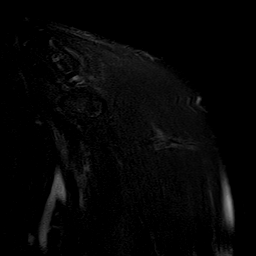
[im 3/20]
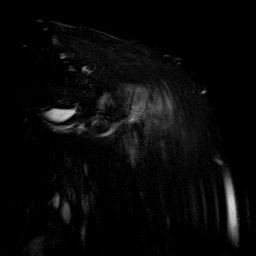
[im 6/20]
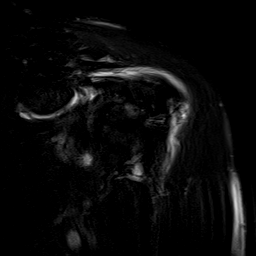
[im 9/20]
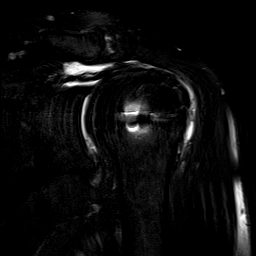
[im 11/20]
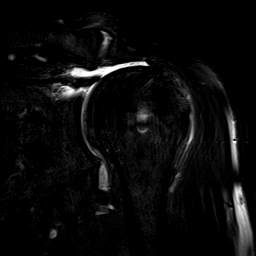
[im 14/20]
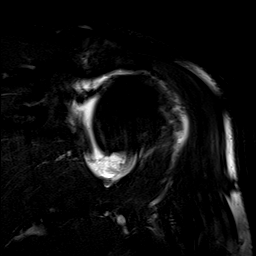
[im 17/20]
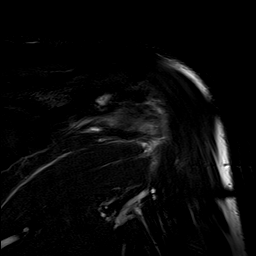
[im 20/20]
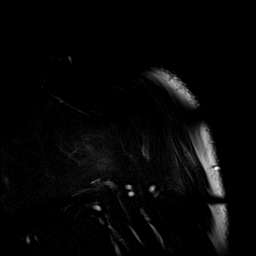

[Series 8: T1 fat-sat · oblique · non-contrast · 4.0mm · 0.44mm/px · 8 of 20 slices shown (3 of 3)]
[im 1/20]
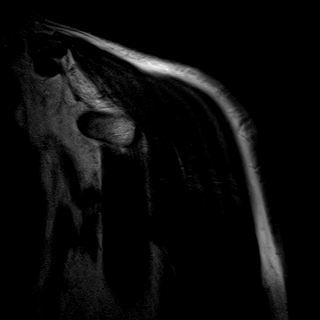
[im 3/20]
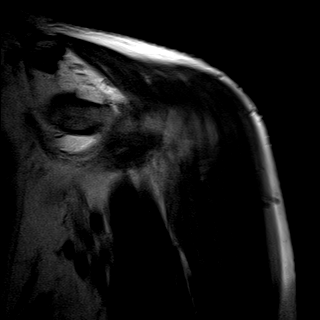
[im 6/20]
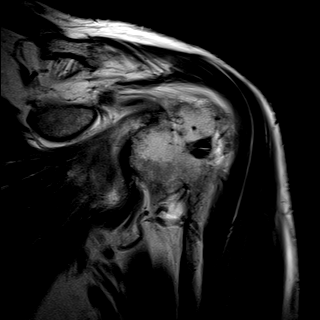
[im 9/20]
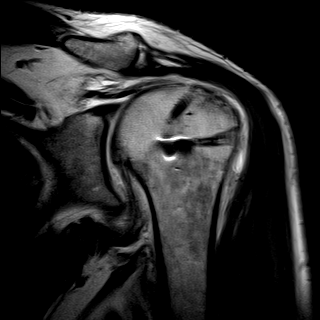
[im 11/20]
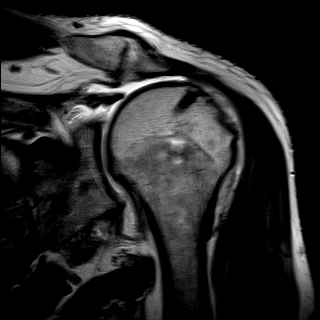
[im 14/20]
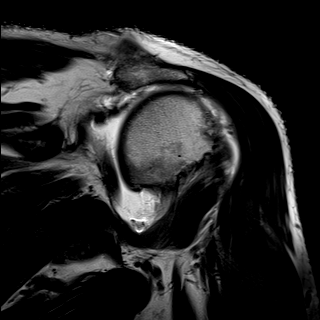
[im 17/20]
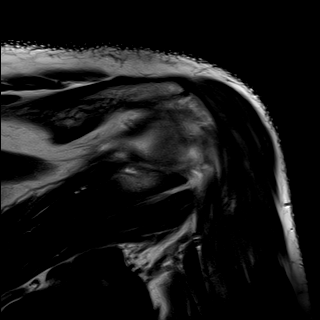
[im 20/20]
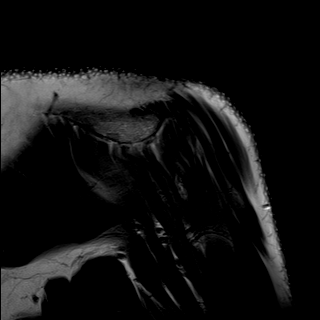

[Series 9: T2 fat-sat · oblique · 4.0mm · 0.55mm/px · 9 of 23 slices shown (2 of 2)]
[im 1/23]
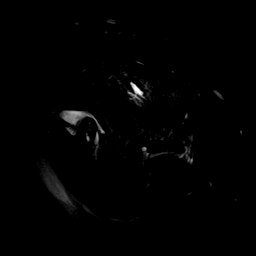
[im 3/23]
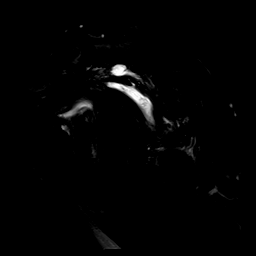
[im 6/23]
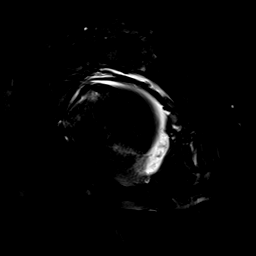
[im 9/23]
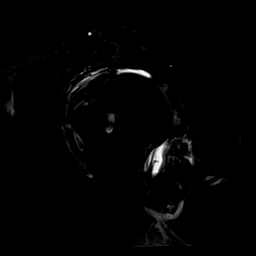
[im 12/23]
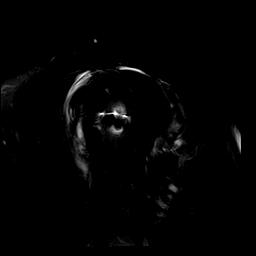
[im 14/23]
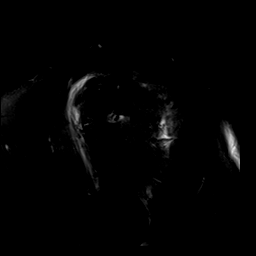
[im 17/23]
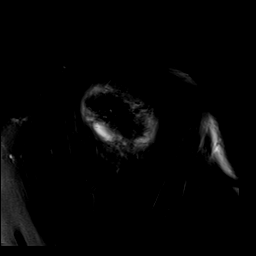
[im 20/23]
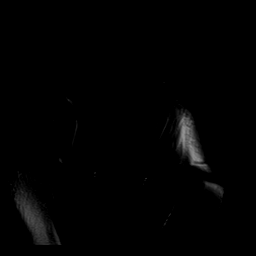
[im 23/23]
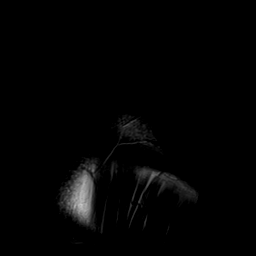

[40 of 40 positions shown; findings below may reference images not displayed]

FINDINGS: Technical note: Examination is significantly degraded by patient
motion artifact. Multiple techniques to mitigate motion including
multiple repeated sequences were attempted. Best possible images
were sent for interpretation.

Rotator cuff: Full-thickness, full width tears of the supraspinatus
and infraspinatus tendons which are retracted to the level of the
glenohumeral joint. Subscapularis tendinosis with probable partial
thickness tears of the more superior aspect of the distal tendon.
Teres minor grossly intact.

Muscles: Poorly characterized. The visualized portion of the
supraspinatus muscle demonstrates fatty infiltration.

Biceps long head: Grossly intact.

Acromioclavicular Joint: Mild degenerative changes of the AC
joint.Moderate amount of injected contrast fills the
subacromial-subdeltoid bursa through rotator cuff defect.

Glenohumeral Joint: Well distended with injected contrast. Internal
heterogeneity suggesting underlying synovitis. Cartilage appears
degenerated, suboptimally evaluated.

Labrum: Significant motion artifact precludes evaluation.

Bones: Humeral head is subluxed superiorly relative to the glenoid
in closely approximates the undersurface of the acromion.
Susceptibility artifact from prior tendon anchors within the humeral
head. No acute fracture.
IMPRESSION: 1. Significantly motion degraded examination.
2. Complete, full-thickness retracted tears of the supraspinatus and
infraspinatus tendons.
3. Subscapularis tendinosis with probable partial thickness tear.
4. High-riding humeral head.
5. Degenerative changes of the glenohumeral and acromioclavicular
joints with probable glenohumeral synovitis.

## 2020-11-08 DIAGNOSIS — L2089 Other atopic dermatitis: Secondary | ICD-10-CM | POA: Diagnosis not present

## 2020-11-08 DIAGNOSIS — B356 Tinea cruris: Secondary | ICD-10-CM | POA: Diagnosis not present

## 2020-11-08 DIAGNOSIS — L710 Perioral dermatitis: Secondary | ICD-10-CM | POA: Diagnosis not present

## 2020-12-01 DIAGNOSIS — M75121 Complete rotator cuff tear or rupture of right shoulder, not specified as traumatic: Secondary | ICD-10-CM | POA: Diagnosis not present

## 2020-12-01 DIAGNOSIS — M62511 Muscle wasting and atrophy, not elsewhere classified, right shoulder: Secondary | ICD-10-CM | POA: Diagnosis not present

## 2020-12-01 DIAGNOSIS — M948X1 Other specified disorders of cartilage, shoulder: Secondary | ICD-10-CM | POA: Diagnosis not present

## 2020-12-01 DIAGNOSIS — M19011 Primary osteoarthritis, right shoulder: Secondary | ICD-10-CM | POA: Diagnosis not present

## 2020-12-28 ENCOUNTER — Other Ambulatory Visit: Payer: Self-pay

## 2020-12-28 ENCOUNTER — Ambulatory Visit: Payer: BC Managed Care – PPO | Admitting: Family Medicine

## 2020-12-28 ENCOUNTER — Encounter: Payer: Self-pay | Admitting: Family Medicine

## 2020-12-28 VITALS — BP 134/70 | HR 88 | Temp 97.6°F | Ht 71.0 in | Wt 238.7 lb

## 2020-12-28 DIAGNOSIS — Z8249 Family history of ischemic heart disease and other diseases of the circulatory system: Secondary | ICD-10-CM

## 2020-12-28 DIAGNOSIS — E782 Mixed hyperlipidemia: Secondary | ICD-10-CM | POA: Diagnosis not present

## 2020-12-28 DIAGNOSIS — I77811 Abdominal aortic ectasia: Secondary | ICD-10-CM

## 2020-12-28 DIAGNOSIS — R1319 Other dysphagia: Secondary | ICD-10-CM

## 2020-12-28 NOTE — Patient Instructions (Signed)
Bradley's Neurology in Clinical Practice (8th ed., pp. 152-163). [place], [place]: Elsevier."> [employer] Textbook of Surgery (21st ed., pp. 1056-1078). [place], [place]: [employer].">  Dysphagia  Dysphagia is trouble swallowing. This condition occurs when solids and liquids stick in a person's throat on the way down to the stomach, or when food takeslonger to get to the stomach than usual. You may have problems swallowing food, liquids, or both. You may also have pain while trying to swallow. It may take you more time and effort to swallowsomething. What are the causes? This condition may be caused by: Muscle problems. These may make it difficult for you to move food and liquids through the esophagus, which is the tube that connects your mouth to your stomach. Blockages. You may have ulcers, scar tissue, or inflammation that blocks the normal passage of food and liquids. Causes of these problems include: Acid reflux from your stomach into your esophagus (gastroesophageal reflux). Infections. Radiation treatment for cancer. Medicines taken without enough fluids to wash them down into your stomach. Stroke. This can affect the nerves and make it difficult to swallow. Nerve problems. These prevent signals from being sent to the muscles of your esophagus to squeeze (contract) and move what you swallow down to your stomach. Globus pharyngeus. This is a common problem that involves a feeling like something is stuck in your throat or a sense of trouble with swallowing, even though nothing is wrong with the swallowing passages. Certain conditions, such as cerebral palsy or Parkinson's disease. What are the signs or symptoms? Common symptoms of this condition include: A feeling that solids or liquids are stuck in your throat on the way down to the stomach. Pain while swallowing. Coughing or gagging while trying to swallow. Other symptoms include: Food moving back from your stomach to your mouth  (regurgitation). Noises coming from your throat. Chest discomfort when swallowing. A feeling of fullness when swallowing. Drooling, especially when the throat is blocked. Heartburn. How is this diagnosed? This condition may be diagnosed by: Barium swallow X-ray. In this test, you will swallow a white liquid that sticks to the inside of your esophagus. X-ray images are then taken. Endoscopy. In this test, a flexible telescope is inserted down your throat to look at your esophagus and your stomach. CT scans or an MRI. How is this treated? Treatment for dysphagia depends on the cause of this condition: If the dysphagia is caused by acid reflux or infection, medicines may be used. These may include antibiotics or heartburn medicines. If the dysphagia is caused by problems with the muscles, swallowing therapy may be used to help you strengthen your swallowing muscles. You may have to do specific exercises to strengthen the muscles or stretch them. If the dysphagia is caused by a blockage or mass, procedures to remove the blockage may be done. You may need surgery and a feeding tube. You may need to make diet changes. Ask your health care provider for specificinstructions. Follow these instructions at home: Medicines Take over-the-counter and prescription medicines only as told by your health care provider. If you were prescribed an antibiotic medicine, take it as told by your health care provider. Do not stop taking the antibiotic even if you start to feel better. Eating and drinking  Make any diet changes as told by your health care provider. Work with a diet and nutrition specialist (dietitian) to create an eating plan that will help you get the nutrients you need in order to stay healthy. Eat soft foods that   are easier to swallow. Cut your food into small pieces and eat slowly. Take small bites. Eat and drink only when you are sitting upright. Do not drink alcohol or caffeine. If you need  help quitting, ask your health care provider.  General instructions Check your weight every day to make sure you are not losing weight. Do not use any products that contain nicotine or tobacco. These products include cigarettes, chewing tobacco, and vaping devices, such as e-cigarettes. If you need help quitting, ask your health care provider. Keep all follow-up visits. This is important. Contact a health care provider if: You lose weight because you cannot swallow. You cough when you drink liquids. You cough up partially digested food. Get help right away if: You cannot swallow your saliva. You have shortness of breath, a fever, or both. Your voice is hoarse and you have trouble swallowing. These symptoms may represent a serious problem that is an emergency. Do not wait to see if the symptoms will go away. Get medical help right away. Call your local emergency services (911 in the [place] Do not drive yourself to the hospital. Summary Dysphagia is trouble swallowing. This condition occurs when solids and liquids stick in a person's throat on the way down to the stomach. You may cough or gag while trying to swallow. Dysphagia has many possible causes. Treatment for dysphagia depends on the cause of the condition. Keep all follow-up visits. This is important. This information is not intended to replace advice given to you by your health care provider. Make sure you discuss any questions you have with your healthcare provider. Document Revised: 01/23/2020 Document Reviewed: 01/23/2020 Elsevier Patient Education  2022 Elsevier Inc.  

## 2020-12-28 NOTE — Assessment & Plan Note (Signed)
He has family history of cardiac disease as well as personal risk factors of hypertension and hyperlipidemia, albeit these are well controlled.  I have ordered cardiac CT for calcium scoring.

## 2020-12-28 NOTE — Assessment & Plan Note (Signed)
He think this may related to hiatal hernia which I discussed with him could be a possibility as a cause for this.  I would recommend that he see GI to discuss possible endoscopy for further evaluation of this.

## 2020-12-28 NOTE — Progress Notes (Signed)
Anthony Lawson - 55 y.o. male MRN [id number]  Date of birth: 09-28-1965  Subjective Chief Complaint  Patient presents with   Hiatal Hernia    HPI Anthony Lawson is a 55 year old male here today with complaint of sensation of food getting stuck when eating.  For the past couple of weeks he has noted that while eating he feels that food gets stuck in his chest area.  This lasted for a couple minutes and then he feels like this releases and he is able to eat again.  He has not really had any issues with liquids.  He denies any nausea.  He does have some intermittent reflux but does not feel like this is uncontrolled.  He also has concerns about possible cardiac disease.  He does report a family history of cardiac disease in his father and brother.  He has not had any anginal symptoms.  He does have a personal history of hyperlipidemia that has been well managed with simvastatin.  His blood pressure has been well controlled with lisinopril.  ROS:  A comprehensive ROS was completed and negative except as noted per HPI  No Known Allergies  Past Medical History:  Diagnosis Date   BPH (benign prostatic hyperplasia) 09/29/2015   ED (erectile dysfunction) 09/29/2015   HTN (hypertension) 09/29/2015   Hyperlipidemia 09/29/2015   Multiple dysplastic nevi    Prurigo nodularis 08/22/2017   Injected left lateral lower leg [facility]    Past Surgical History:  Procedure Laterality Date   Dysplastic nevi removal left scapular back April 2019 Left 10/01/2017   [facility]   ROTATOR CUFF REPAIR Left 2012   x 2    Social History   Socioeconomic History   Marital status: Married    Spouse name: Not on file   Number of children: 0   Years of education: Not on file   Highest education level: Not on file  Occupational History   Occupation: Sales  Tobacco Use   Smoking status: Never   Smokeless tobacco: Never  Vaping Use   Vaping Use: Never used  Substance and Sexual  Activity   Alcohol use: Yes    Comment: 6-8 per week   Drug use: No   Sexual activity: Yes  Other Topics Concern   Not on file  Social History Narrative   1 adopted daughter   Social Determinants of Health   Financial Resource Strain: Not on file  Food Insecurity: Not on file  Transportation Needs: Not on file  Physical Activity: Not on file  Stress: Not on file  Social Connections: Not on file    Family History  Problem Relation Age of Onset   Breast cancer Mother    Bone cancer Mother    Heart disease Father    Hypertension Father    Heart disease Brother    Diabetes Maternal Aunt    Lung cancer Maternal Grandmother    Lung cancer Maternal Grandfather    Hypertension Paternal Aunt        x 2   Heart disease Paternal Aunt        x 2   Diabetes Paternal Aunt    Hypertension Paternal Uncle        x 2   Heart disease Paternal Uncle        x 2    Health Maintenance  Topic Date Due   HIV Screening  Never done   Hepatitis C Screening  Never done   Zoster Vaccines-  Shingrix (1 of 2) Never done   COVID-19 Vaccine (4 - Booster for Pfizer series) 08/30/2020   INFLUENZA VACCINE  01/16/2021   TETANUS/TDAP  12/03/2022   COLONOSCOPY (Pts 45-62yrs Insurance coverage will need to be confirmed)  06/07/2030   Pneumococcal Vaccine 14-46 Years old  Aged Out   HPV VACCINES  Aged Out     ----------------------------------------------------------------------------------------------------------------------------------------------------------------------------------------------------------------- Physical Exam BP 134/70 (BP Location: Left Arm, Patient Position: Sitting, Cuff Size: Large)   Pulse 88   Temp 97.6 F (36.4 C)   Ht 5\' 11"  (1.803 m)   Wt 238 lb 11.2 oz (108.3 kg)   SpO2 98%   BMI 33.29 kg/m   Physical Exam Constitutional:      Appearance: Normal appearance.  HENT:     Head: Normocephalic and atraumatic.  Eyes:     General: No scleral  icterus. Cardiovascular:     Rate and Rhythm: Normal rate and regular rhythm.  Pulmonary:     Effort: Pulmonary effort is normal.     Breath sounds: Normal breath sounds.  Abdominal:     General: Abdomen is flat. There is no distension.     Palpations: Abdomen is soft.     Tenderness: There is no abdominal tenderness.  Musculoskeletal:     Cervical back: Neck supple.  Neurological:     General: No focal deficit present.     Mental Status: He is alert.  Psychiatric:        Mood and Affect: Mood normal.        Behavior: Behavior normal.    ------------------------------------------------------------------------------------------------------------------------------------------------------------------------------------------------------------------- Assessment and Plan  Family history of early CAD He has family history of cardiac disease as well as personal risk factors of hypertension and hyperlipidemia, albeit these are well controlled.  I have ordered cardiac CT for calcium scoring.  Esophageal dysphagia He think this may related to hiatal hernia which I discussed with him could be a possibility as a cause for this.  I would recommend that he see GI to discuss possible endoscopy for further evaluation of this.   No orders of the defined types were placed in this encounter.   No follow-ups on file.    This visit occurred during the SARS-CoV-2 public health emergency.  Safety protocols were in place, including screening questions prior to the visit, additional usage of staff PPE, and extensive cleaning of exam room while observing appropriate contact time as indicated for disinfecting solutions.

## 2021-01-02 ENCOUNTER — Other Ambulatory Visit: Payer: Self-pay

## 2021-01-02 ENCOUNTER — Ambulatory Visit (INDEPENDENT_AMBULATORY_CARE_PROVIDER_SITE_OTHER): Payer: Self-pay

## 2021-01-02 DIAGNOSIS — Z8249 Family history of ischemic heart disease and other diseases of the circulatory system: Secondary | ICD-10-CM

## 2021-01-02 DIAGNOSIS — I77811 Abdominal aortic ectasia: Secondary | ICD-10-CM

## 2021-01-02 DIAGNOSIS — E782 Mixed hyperlipidemia: Secondary | ICD-10-CM

## 2021-01-04 ENCOUNTER — Encounter: Payer: Self-pay | Admitting: Family Medicine

## 2021-01-05 ENCOUNTER — Encounter: Payer: Self-pay | Admitting: Internal Medicine

## 2021-01-05 ENCOUNTER — Ambulatory Visit: Payer: BC Managed Care – PPO | Admitting: Internal Medicine

## 2021-01-05 VITALS — BP 140/82 | HR 80 | Ht 71.65 in | Wt 240.1 lb

## 2021-01-05 DIAGNOSIS — R1319 Other dysphagia: Secondary | ICD-10-CM

## 2021-01-05 NOTE — Progress Notes (Signed)
HISTORY OF PRESENT ILLNESS:  Anthony Lawson is a 55 y.o. 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  I saw the patient October 2021 regarding surveillance colonoscopy.  He subsequently underwent colonoscopy June 07, 2020.  He was found to have a diminutive hyperplastic polyp, diverticulosis, and internal hemorrhoids.  Follow-up in 10 years recommended.  Patient's current history is that of significant intermittent solid food dysphagia with increasing frequency since February of this year.  Currently having difficulties 4 or 5 times per week.  On 1 occasion he has had to induce vomiting for relief.  He rarely has reflux symptoms.  He is on no PPI.  He does tell me that he has had similar problem over the years, though less frequently.  He denies weight loss.  GI review of systems is otherwise negative.  REVIEW OF SYSTEMS:  All non-GI ROS negative except for sinus and allergy  Past Medical History:  Diagnosis Date   BPH (benign prostatic hyperplasia) 09/29/2015   ED (erectile dysfunction) 09/29/2015   HTN (hypertension) 09/29/2015   Hyperlipidemia 09/29/2015   Multiple dysplastic nevi    Prurigo nodularis 08/22/2017   Injected left lateral lower leg [facility]    Past Surgical History:  Procedure Laterality Date   Dysplastic nevi removal left scapular back April 2019 Left 10/01/2017   [facility]   ROTATOR CUFF REPAIR Left 2012   x 2    Social History [facility]  reports that he has never smoked. He has never used smokeless tobacco. He reports current alcohol use. He reports that he does not use drugs.  family history includes Bone cancer in his mother; Breast cancer in his mother; Diabetes in his maternal aunt and paternal aunt; Heart disease in his brother, father, paternal aunt, and paternal uncle; Hypertension in his father, paternal aunt, and  paternal uncle; Lung cancer in his maternal grandfather and maternal grandmother.  No Known Allergies     PHYSICAL EXAMINATION: Vital signs: BP 140/82 (BP Location: Left Arm, Patient Position: Sitting, Cuff Size: Normal)   Pulse 80   Ht 5' 11.65" (1.82 m)   Wt 240 lb 2 oz (108.9 kg)   BMI 32.89 kg/m   Constitutional: generally well-appearing, no acute distress Psychiatric: alert and oriented x3, cooperative Eyes: extraocular movements intact, anicteric, conjunctiva pink Mouth: oral pharynx moist, no lesions Neck: supple no lymphadenopathy Cardiovascular: heart regular rate and rhythm, no murmur Lungs: clear to auscultation bilaterally Abdomen: soft, nontender, nondistended, no obvious ascites, no peritoneal signs, normal bowel sounds, no organomegaly Rectal: Omitted Extremities: no clubbing, cyanosis, or lower extremity edema bilaterally Skin: no lesions on visible extremities Neuro: No focal deficits.  Cranial nerves intact  ASSESSMENT:  1.  Worsening intermittent solid food dysphagia.  Rule out peptic stricture.  Rule out ring.  Rule out neoplasia. 2.  Screening colonoscopy exam, negative for neoplasia 3.  Rare reflux symptoms  PLAN:  1.  Schedule upper endoscopy with possible esophageal dilation.The nature of the procedure, as well as the risks, benefits, and alternatives were carefully and thoroughly reviewed with the patient. Ample time for discussion and questions allowed. The patient understood, was satisfied, and agreed to proceed. 2.  Require PPI therapy problem is reflux related.  We discussed this 3.  Routine screening colonoscopy around 2031

## 2021-01-05 NOTE — Patient Instructions (Signed)
If you are age 55 or older, your body mass index should be between 23-30. Your Body mass index is 32.89 kg/m. If this is out of the aforementioned range listed, please consider follow up with your Primary Care Provider.  If you are age 7 or younger, your body mass index should be between 19-25. Your Body mass index is 32.89 kg/m. If this is out of the aformentioned range listed, please consider follow up with your Primary Care Provider.   __________________________________________________________  The [facility] providers would like to encourage you to use [facility] to communicate with providers for non-urgent requests or questions.  Due to long hold times on the telephone, sending your provider a message by [facility] may be a faster and more efficient way to get a response.  Please allow 48 business hours for a response.  Please remember that this is for non-urgent requests.   You have been scheduled for an endoscopy. Please follow written instructions given to you at your visit today. If you use inhalers (even only as needed), please bring them with you on the day of your procedure.

## 2021-01-10 ENCOUNTER — Ambulatory Visit (AMBULATORY_SURGERY_CENTER): Payer: BC Managed Care – PPO | Admitting: Internal Medicine

## 2021-01-10 ENCOUNTER — Other Ambulatory Visit: Payer: Self-pay

## 2021-01-10 ENCOUNTER — Encounter: Payer: Self-pay | Admitting: Internal Medicine

## 2021-01-10 VITALS — BP 127/73 | HR 77 | Temp 97.7°F | Resp 14 | Ht 71.0 in | Wt 240.0 lb

## 2021-01-10 DIAGNOSIS — R1319 Other dysphagia: Secondary | ICD-10-CM | POA: Diagnosis not present

## 2021-01-10 DIAGNOSIS — K219 Gastro-esophageal reflux disease without esophagitis: Secondary | ICD-10-CM | POA: Diagnosis not present

## 2021-01-10 DIAGNOSIS — K2100 Gastro-esophageal reflux disease with esophagitis, without bleeding: Secondary | ICD-10-CM

## 2021-01-10 DIAGNOSIS — K222 Esophageal obstruction: Secondary | ICD-10-CM | POA: Diagnosis not present

## 2021-01-10 DIAGNOSIS — K449 Diaphragmatic hernia without obstruction or gangrene: Secondary | ICD-10-CM

## 2021-01-10 MED ORDER — PANTOPRAZOLE SODIUM 40 MG PO TBEC: 40.0000 mg | DELAYED_RELEASE_TABLET | Freq: Every day | ORAL | 11 refills | Status: DC

## 2021-01-10 MED ORDER — SODIUM CHLORIDE 0.9 % IV SOLN: 500.0000 mL | Freq: Once | INTRAVENOUS | Status: DC

## 2021-01-10 NOTE — Progress Notes (Signed)
Called to room to assist during endoscopic procedure.  Patient ID and intended procedure confirmed with present staff. Received instructions for my participation in the procedure from the performing physician.  

## 2021-01-10 NOTE — Progress Notes (Signed)
pt tolerated well. VSS. awake and to recovery. Report given to RN.  Bite block manipulated for bougie and then removed at end of case by MD. No trauma noted.

## 2021-01-10 NOTE — Op Note (Signed)
[facility] Patient Name: Anthony Lawson Procedure Date: 01/10/2021 10:03 AM MRN: [id number] Endoscopist: Docia Chuck. Henrene Pastor , MD Age: 55 Referring MD:  Date of Birth: 25-Aug-1965 Gender: Male Account #: [id number] Procedure:                Upper GI endoscopy with [facility] dilation of the                            esophagus. 43 French Indications:              Dysphagia Medicines:                Monitored Anesthesia Care Procedure:                Pre-Anesthesia Assessment:                           - Prior to the procedure, a History and Physical                            was performed, and patient medications and                            allergies were reviewed. The patient's tolerance of                            previous anesthesia was also reviewed. The risks                            and benefits of the procedure and the sedation                            options and risks were discussed with the patient.                            All questions were answered, and informed consent                            was obtained. Prior Anticoagulants: The patient has                            taken no previous anticoagulant or antiplatelet                            agents. ASA Grade Assessment: II - A patient with                            mild systemic disease. After reviewing the risks                            and benefits, the patient was deemed in                            satisfactory condition to undergo the procedure.  After obtaining informed consent, the endoscope was                            passed under direct vision. Throughout the                            procedure, the patient's blood pressure, pulse, and                            oxygen saturations were monitored continuously. The                            Endoscope was introduced through the mouth, and                            advanced to the duodenal bulb. The upper GI                             endoscopy was accomplished without difficulty. The                            patient tolerated the procedure well. Scope In: Scope Out: Findings:                 One benign-appearing, intrinsic moderate stenosis                            was found 40 cm from the incisors. This stenosis                            measured 1.4 cm (inner diameter). After completing                            the entire endoscopic survey, the scope was                            withdrawn. Dilation was performed with a Maloney                            dilator with no resistance at 29 Fr.                           The exam of the esophagus revealed erosive                            esophagitis at the gastroesophageal junction with                            friability and edema. No Barrett's esophagus.                           The stomach was normal, save small hiatal hernia.                           The  examined duodenum was normal.                           The cardia and gastric fundus were normal on                            retroflexion. Complications:            No immediate complications. Estimated Blood Loss:     Estimated blood loss: none. Impression:               1. GERD with erosive esophagitis and peptic                            stricture status post dilation                           2. Small hiatal hernia. Otherwise unremarkable EGD. Recommendation:           1. Patient has a contact number available for                            emergencies. The signs and symptoms of potential                            delayed complications were discussed with the                            patient. Return to normal activities tomorrow.                            Written discharge instructions were provided to the                            patient.                           2. Post dilation diet.                           3. Prescribe pantoprazole 40 mg daily; #30; 11                             refills                           4. Office follow-up with Dr. Henrene Pastor in 6 weeks. Docia Chuck. Henrene Pastor, MD 01/10/2021 10:29:01 AM This report has been signed electronically.

## 2021-01-10 NOTE — Patient Instructions (Signed)
Please read handouts provided. Post Dilation Diet. Begin pantoprazole 40 mg once daily. Office follow-up with Dr. Henrene Pastor in 6 weeks.    YOU HAD AN ENDOSCOPIC PROCEDURE TODAY AT [facility]:   Refer to the procedure report that was given to you for any specific questions about what was found during the examination.  If the procedure report does not answer your questions, please call your gastroenterologist to clarify.  If you requested that your care partner not be given the details of your procedure findings, then the procedure report has been included in a sealed envelope for you to review at your convenience later.  YOU SHOULD EXPECT: Some feelings of bloating in the abdomen. Passage of more gas than usual.  Walking can help get rid of the air that was put into your GI tract during the procedure and reduce the bloating. If you had a lower endoscopy (such as a colonoscopy or flexible sigmoidoscopy) you may notice spotting of blood in your stool or on the toilet paper. If you underwent a bowel prep for your procedure, you may not have a normal bowel movement for a few days.  Please Note:  You might notice some irritation and congestion in your nose or some drainage.  This is from the oxygen used during your procedure.  There is no need for concern and it should clear up in a day or so.  SYMPTOMS TO REPORT IMMEDIATELY:    Following upper endoscopy (EGD)  Vomiting of blood or coffee ground material  New chest pain or pain under the shoulder blades  Painful or persistently difficult swallowing  New shortness of breath  Fever of 100F or higher  Black, tarry-looking stools  For urgent or emergent issues, a gastroenterologist can be reached at any hour by calling [phone]. Do not use MyChart messaging for urgent concerns.    DIET:  Drink plenty of fluids but you should avoid alcoholic beverages for 24 hours.  ACTIVITY:  You should plan to take it easy for the rest of  today and you should NOT DRIVE or use heavy machinery until tomorrow (because of the sedation medicines used during the test).    FOLLOW UP: Our staff will call the number listed on your records 48-72 hours following your procedure to check on you and address any questions or concerns that you may have regarding the information given to you following your procedure. If we do not reach you, we will leave a message.  We will attempt to reach you two times.  During this call, we will ask if you have developed any symptoms of COVID 19. If you develop any symptoms (ie: fever, flu-like symptoms, shortness of breath, cough etc.) before then, please call [phone].  If you test positive for Covid 19 in the 2 weeks post procedure, please call and report this information to [place].    If any biopsies were taken you will be contacted by phone or by letter within the next 1-3 weeks.  Please call us at [phone] if you have not heard about the biopsies in 3 weeks.    SIGNATURES/CONFIDENTIALITY: You and/or your care partner have signed paperwork which will be entered into your electronic medical record.  These signatures attest to the fact that that the information above on your After Visit Summary has been reviewed and is understood.  Full responsibility of the confidentiality of this discharge information lies with you and/or your care-partner.

## 2021-01-10 NOTE — Progress Notes (Signed)
C.W. vital signs. 

## 2021-01-10 NOTE — Progress Notes (Signed)
Pt's states no medical or surgical changes since previsit or office visit.Pt's states no medical or surgical changes since previsit or office visit. 

## 2021-01-12 ENCOUNTER — Telehealth: Payer: Self-pay | Admitting: *Deleted

## 2021-01-12 NOTE — Telephone Encounter (Signed)
  Follow up Call-  Call back number 01/10/2021 06/07/2020  Post procedure Call Back phone  # [phone]  Permission to leave phone message Yes Yes  Some recent data might be hidden     Patient questions:  Do you have a fever, pain , or abdominal swelling? No. Pain Score  0 *  Have you tolerated food without any problems? Yes.    Have you been able to return to your normal activities? Yes.    Do you have any questions about your discharge instructions: Diet   No. Medications  No. Follow up visit  No.  Do you have questions or concerns about your Care? No.  Actions: * If pain score is 4 or above: No action needed, pain <4.  Pt reports having a cough since procedure.  He states that he does not have any other symptoms.  His cough is producing a small amount but not much.  Instructed him to call back if symptoms don't improve or if they worsen.

## 2021-02-07 ENCOUNTER — Other Ambulatory Visit: Payer: Self-pay | Admitting: Family Medicine

## 2021-02-07 DIAGNOSIS — N401 Enlarged prostate with lower urinary tract symptoms: Secondary | ICD-10-CM

## 2021-02-07 DIAGNOSIS — E782 Mixed hyperlipidemia: Secondary | ICD-10-CM

## 2021-02-07 DIAGNOSIS — I10 Essential (primary) hypertension: Secondary | ICD-10-CM

## 2021-02-09 DIAGNOSIS — S46012D Strain of muscle(s) and tendon(s) of the rotator cuff of left shoulder, subsequent encounter: Secondary | ICD-10-CM | POA: Diagnosis not present

## 2021-02-13 ENCOUNTER — Ambulatory Visit (INDEPENDENT_AMBULATORY_CARE_PROVIDER_SITE_OTHER): Payer: BC Managed Care – PPO | Admitting: Family Medicine

## 2021-02-13 ENCOUNTER — Encounter: Payer: Self-pay | Admitting: Family Medicine

## 2021-02-13 ENCOUNTER — Other Ambulatory Visit: Payer: Self-pay

## 2021-02-13 ENCOUNTER — Other Ambulatory Visit: Payer: Self-pay | Admitting: Family Medicine

## 2021-02-13 VITALS — BP 144/79 | HR 70 | Temp 97.8°F | Ht 71.0 in | Wt 244.0 lb

## 2021-02-13 DIAGNOSIS — E782 Mixed hyperlipidemia: Secondary | ICD-10-CM | POA: Diagnosis not present

## 2021-02-13 DIAGNOSIS — R3912 Poor urinary stream: Secondary | ICD-10-CM | POA: Diagnosis not present

## 2021-02-13 DIAGNOSIS — I10 Essential (primary) hypertension: Secondary | ICD-10-CM

## 2021-02-13 DIAGNOSIS — Z0000 Encounter for general adult medical examination without abnormal findings: Secondary | ICD-10-CM

## 2021-02-13 DIAGNOSIS — N401 Enlarged prostate with lower urinary tract symptoms: Secondary | ICD-10-CM

## 2021-02-13 NOTE — Patient Instructions (Signed)
Preventive Care 55-55 Years Old, Male Preventive care refers to lifestyle choices and visits with your health care provider that can promote health and wellness. This includes: A yearly physical exam. This is also called an annual wellness visit. Regular dental and eye exams. Immunizations. Screening for certain conditions. Healthy lifestyle choices, such as: Eating a healthy diet. Getting regular exercise. Not using drugs or products that contain nicotine and tobacco. Limiting alcohol use. What can I expect for my preventive care visit? Physical exam Your health care provider will check your: Height and weight. These may be used to calculate your BMI (body mass index). BMI is a measurement that tells if you are at a healthy weight. Heart rate and blood pressure. Body temperature. Skin for abnormal spots. Counseling Your health care provider may ask you questions about your: Past medical problems. Family's medical history. Alcohol, tobacco, and drug use. Emotional well-being. Home life and relationship well-being. Sexual activity. Diet, exercise, and sleep habits. Work and work environment. Access to firearms. What immunizations do I need?  Vaccines are usually given at various ages, according to a schedule. Your health care provider will recommend vaccines for you based on your age, medicalhistory, and lifestyle or other factors, such as travel or where you work. What tests do I need? Blood tests Lipid and cholesterol levels. These may be checked every 5 years, or more often if you are over 55 years old. Hepatitis C test. Hepatitis B test. Screening Lung cancer screening. You may have this screening every year starting at age 55 if you have a 30-pack-year history of smoking and currently smoke or have quit within the past 15 years. Prostate cancer screening. Recommendations will vary depending on your family history and other risks. Genital exam to check for testicular cancer  or hernias. Colorectal cancer screening. All adults should have this screening starting at age 55 and continuing until age 75. Your health care provider may recommend screening at age 55 if you are at increased risk. You will have tests every 1-10 years, depending on your results and the type of screening test. Diabetes screening. This is done by checking your blood sugar (glucose) after you have not eaten for a while (fasting). You may have this done every 1-3 years. STD (sexually transmitted disease) testing, if you are at risk. Follow these instructions at home: Eating and drinking  Eat a diet that includes fresh fruits and vegetables, whole grains, lean protein, and low-fat dairy products. Take vitamin and mineral supplements as recommended by your health care provider. Do not drink alcohol if your health care provider tells you not to drink. If you drink alcohol: Limit how much you have to 0-2 drinks a day. Be aware of how much alcohol is in your drink. In the [place] one drink equals one 12 oz bottle of beer (355 mL), one 5 oz glass of wine (148 mL), or one 1 oz glass of hard liquor (44 mL).  Lifestyle Take daily care of your teeth and gums. Brush your teeth every morning and night with fluoride toothpaste. Floss one time each day. Stay active. Exercise for at least 30 minutes 5 or more days each week. Do not use any products that contain nicotine or tobacco, such as cigarettes, e-cigarettes, and chewing tobacco. If you need help quitting, ask your health care provider. Do not use drugs. If you are sexually active, practice safe sex. Use a condom or other form of protection to prevent STIs (sexually transmitted infections). If told by   your health care provider, take low-dose aspirin daily starting at age 55. Find healthy ways to cope with stress, such as: Meditation, yoga, or listening to music. Journaling. Talking to a trusted person. Spending time with friends and  family. Safety Always wear your seat belt while driving or riding in a vehicle. Do not drive: If you have been drinking alcohol. Do not ride with someone who has been drinking. When you are tired or distracted. While texting. Wear a helmet and other protective equipment during sports activities. If you have firearms in your house, make sure you follow all gun safety procedures. What's next? Go to your health care provider once a year for an annual wellness visit. Ask your health care provider how often you should have your eyes and teeth checked. Stay up to date on all vaccines. This information is not intended to replace advice given to you by your health care provider. Make sure you discuss any questions you have with your healthcare provider. Document Revised: 03/03/2019 Document Reviewed: 05/29/2018 Elsevier Patient Education  2022 Elsevier Inc.  

## 2021-02-13 NOTE — Progress Notes (Signed)
Anthony JURIS - 55 y.o. male MRN [id number]  Date of birth: December 18, 1965  Subjective Chief Complaint  Patient presents with   Annual Exam    HPI Anthony Lawson  is a 55 year old male here today for annual exam.  He reports that overall he is doing well.  He has questions about second COVID booster.  He does plan to travel quite a bit over the next few months.  He is also planning on getting shingles vaccine but is going to wait a couple weeks after getting his COVID booster.  He has not been very active over the summer.  He feels like his diet has been pretty good.  He is a non-smoker.  He consumes alcohol socially but not in excess.  He would like to have a digital prostate exam in addition to PSA levels checked today due to his history of BPH.  Review of Systems  Constitutional:  Negative for chills, fever, malaise/fatigue and weight loss.  HENT:  Negative for congestion, ear pain and sore throat.   Eyes:  Negative for blurred vision, double vision and pain.  Respiratory:  Negative for cough and shortness of breath.   Cardiovascular:  Negative for chest pain and palpitations.  Gastrointestinal:  Negative for abdominal pain, blood in stool, constipation, heartburn and nausea.  Genitourinary:  Negative for dysuria and urgency.  Musculoskeletal:  Negative for joint pain and myalgias.  Neurological:  Negative for dizziness and headaches.  Endo/Heme/Allergies:  Does not bruise/bleed easily.  Psychiatric/Behavioral:  Negative for depression. The patient is not nervous/anxious and does not have insomnia.    No Known Allergies  Past Medical History:  Diagnosis Date   BPH (benign prostatic hyperplasia) 09/29/2015   ED (erectile dysfunction) 09/29/2015   HTN (hypertension) 09/29/2015   Hyperlipidemia 09/29/2015   Multiple dysplastic nevi    Prurigo nodularis 08/22/2017   Injected left lateral lower leg [facility]    Past Surgical History:  Procedure Laterality  Date   Dysplastic nevi removal left scapular back April 2019 Left 10/01/2017   [facility]   ROTATOR CUFF REPAIR Left 2012   x 2    Social History   Socioeconomic History   Marital status: Married    Spouse name: Not on file   Number of children: 0   Years of education: Not on file   Highest education level: Not on file  Occupational History   Occupation: Sales  Tobacco Use   Smoking status: Never   Smokeless tobacco: Never  Vaping Use   Vaping Use: Never used  Substance and Sexual Activity   Alcohol use: Yes    Comment: 6-8 per week   Drug use: No   Sexual activity: Yes  Other Topics Concern   Not on file  Social History Narrative   1 adopted daughter   Social Determinants of Health   Financial Resource Strain: Not on file  Food Insecurity: Not on file  Transportation Needs: Not on file  Physical Activity: Not on file  Stress: Not on file  Social Connections: Not on file    Family History  Problem Relation Age of Onset   Breast cancer Mother    Bone cancer Mother    Heart disease Father    Hypertension Father    Heart disease Brother    Diabetes Maternal Aunt    Lung cancer Maternal Grandmother    Lung cancer Maternal Grandfather    Hypertension Paternal Aunt  x 2   Heart disease Paternal Aunt        x 2   Diabetes Paternal Aunt    Hypertension Paternal Uncle        x 2   Heart disease Paternal Uncle        x 2    Health Maintenance  Topic Date Due   HIV Screening  Never done   Hepatitis C Screening  Never done   Zoster Vaccines- Shingrix (1 of 2) Never done   COVID-19 Vaccine (4 - Booster for Pfizer series) 08/30/2020   INFLUENZA VACCINE  01/16/2021   TETANUS/TDAP  12/03/2022   COLONOSCOPY (Pts 45-59yr Insurance coverage will need to be confirmed)  06/07/2030   Pneumococcal Vaccine [id number] old  Aged Out   HPV VACCINES  Aged Out      ----------------------------------------------------------------------------------------------------------------------------------------------------------------------------------------------------------------- Physical Exam BP (!) 144/79 (BP Location: Left Arm, Patient Position: Sitting, Cuff Size: Large)   Pulse 70   Temp 97.8 F (36.6 C)   Ht '5\' 11"'$  (1.803 m)   Wt 244 lb (110.7 kg)   SpO2 97%   BMI 34.03 kg/m   Physical Exam Constitutional:      General: He is not in acute distress. HENT:     Head: Normocephalic and atraumatic.     Right Ear: Tympanic membrane and external ear normal.     Left Ear: Tympanic membrane and external ear normal.  Eyes:     General: No scleral icterus. Neck:     Thyroid: No thyromegaly.  Cardiovascular:     Rate and Rhythm: Normal rate and regular rhythm.     Heart sounds: Normal heart sounds.  Pulmonary:     Effort: Pulmonary effort is normal.     Breath sounds: Normal breath sounds.  Abdominal:     General: Bowel sounds are normal. There is no distension.     Palpations: Abdomen is soft.     Tenderness: There is no abdominal tenderness. There is no guarding.  Genitourinary:    Prostate: Normal.     Rectum: Normal.  Musculoskeletal:     Cervical back: Normal range of motion.  Lymphadenopathy:     Cervical: No cervical adenopathy.  Skin:    General: Skin is warm and dry.     Findings: No rash.  Neurological:     Mental Status: He is alert and oriented to person, place, and time.     Cranial Nerves: No cranial nerve deficit.     Motor: No abnormal muscle tone.  Psychiatric:        Mood and Affect: Mood normal.        Behavior: Behavior normal.    ------------------------------------------------------------------------------------------------------------------------------------------------------------------------------------------------------------------- Assessment and Plan  Well adult exam Well adult Orders Placed This  Encounter  Procedures   COMPLETE METABOLIC PANEL WITH GFR   CBC with Differential   Lipid Panel w/reflex Direct LDL   PSA   TSH  Immunizations: He will have second COVID booster at his pharmacy.  He will also plan to have shingles vaccine a couple weeks after he gets his COVID booster. Screenings: Update PSA Anticipatory guidance/risk factor reduction: Recommendations per AVS.   No orders of the defined types were placed in this encounter.   No follow-ups on file.    This visit occurred during the SARS-CoV-2 public health emergency.  Safety protocols were in place, including screening questions prior to the visit, additional usage of staff PPE, and extensive cleaning of exam room while observing appropriate contact time  as indicated for disinfecting solutions.

## 2021-02-13 NOTE — Assessment & Plan Note (Signed)
Well adult Orders Placed This Encounter  Procedures  . COMPLETE METABOLIC PANEL WITH GFR  . CBC with Differential  . Lipid Panel w/reflex Direct LDL  . PSA  . TSH  Immunizations: He will have second COVID booster at his pharmacy.  He will also plan to have shingles vaccine a couple weeks after he gets his COVID booster. Screenings: Update PSA Anticipatory guidance/risk factor reduction: Recommendations per AVS.

## 2021-02-14 ENCOUNTER — Encounter: Payer: Self-pay | Admitting: Family Medicine

## 2021-02-14 LAB — PSA: PSA: 2.28 ng/mL (ref <=4.00)

## 2021-02-14 LAB — CBC WITH DIFFERENTIAL/PLATELET
Absolute Monocytes: 480 cells/uL (ref 200–950)
Basophils Absolute: 59 cells/uL (ref 0–200)
Basophils Relative: 1.2 %
Eosinophils Absolute: 191 cells/uL (ref 15–500)
Eosinophils Relative: 3.9 %
HCT: 45.2 % (ref 38.5–50.0)
Hemoglobin: 15.1 g/dL (ref 13.2–17.1)
Lymphs Abs: 1137 cells/uL (ref 850–3900)
MCH: 30 pg (ref 27.0–33.0)
MCHC: 33.4 g/dL (ref 32.0–36.0)
MCV: 89.9 fL (ref 80.0–100.0)
MPV: 10.4 fL (ref 7.5–12.5)
Monocytes Relative: 9.8 %
Neutro Abs: 3033 cells/uL (ref 1500–7800)
Neutrophils Relative %: 61.9 %
Platelets: 208 10*3/uL (ref 140–400)
RBC: 5.03 10*6/uL (ref 4.20–5.80)
RDW: 12.8 % (ref 11.0–15.0)
Total Lymphocyte: 23.2 %
WBC: 4.9 10*3/uL (ref 3.8–10.8)

## 2021-02-14 LAB — COMPLETE METABOLIC PANEL WITH GFR
AG Ratio: 1.8 (calc) (ref 1.0–2.5)
ALT: 28 U/L (ref 9–46)
AST: 20 U/L (ref 10–35)
Albumin: 4.2 g/dL (ref 3.6–5.1)
Alkaline phosphatase (APISO): 57 U/L (ref 35–144)
BUN: 18 mg/dL (ref 7–25)
CO2: 27 mmol/L (ref 20–32)
Calcium: 9.3 mg/dL (ref 8.6–10.3)
Chloride: 107 mmol/L (ref 98–110)
Creat: 0.79 mg/dL (ref 0.70–1.30)
Globulin: 2.3 g/dL (calc) (ref 1.9–3.7)
Glucose, Bld: 96 mg/dL (ref 65–99)
Potassium: 4.2 mmol/L (ref 3.5–5.3)
Sodium: 141 mmol/L (ref 135–146)
Total Bilirubin: 0.6 mg/dL (ref 0.2–1.2)
Total Protein: 6.5 g/dL (ref 6.1–8.1)
eGFR: 105 mL/min/{1.73_m2} (ref >=60)

## 2021-02-14 LAB — LIPID PANEL W/REFLEX DIRECT LDL
Cholesterol: 161 mg/dL (ref <200)
HDL: 45 mg/dL (ref >=40)
LDL Cholesterol (Calc): 92 mg/dL (calc)
Non-HDL Cholesterol (Calc): 116 mg/dL (calc) (ref <130)
Total CHOL/HDL Ratio: 3.6 (calc) (ref <5.0)
Triglycerides: 142 mg/dL (ref <150)

## 2021-02-14 LAB — TSH: TSH: 2.32 mIU/L (ref 0.40–4.50)

## 2021-03-02 ENCOUNTER — Other Ambulatory Visit: Payer: Self-pay | Admitting: Family Medicine

## 2021-03-02 DIAGNOSIS — R350 Frequency of micturition: Secondary | ICD-10-CM

## 2021-03-02 DIAGNOSIS — N401 Enlarged prostate with lower urinary tract symptoms: Secondary | ICD-10-CM

## 2021-06-28 ENCOUNTER — Encounter: Payer: Self-pay | Admitting: Internal Medicine

## 2021-06-28 ENCOUNTER — Ambulatory Visit: Payer: Managed Care, Other (non HMO) | Admitting: Internal Medicine

## 2021-06-28 VITALS — BP 128/34 | HR 80 | Ht 72.0 in | Wt 243.0 lb

## 2021-06-28 DIAGNOSIS — K2100 Gastro-esophageal reflux disease with esophagitis, without bleeding: Secondary | ICD-10-CM

## 2021-06-28 DIAGNOSIS — R1319 Other dysphagia: Secondary | ICD-10-CM | POA: Diagnosis not present

## 2021-06-28 DIAGNOSIS — Z86010 Personal history of colonic polyps: Secondary | ICD-10-CM | POA: Diagnosis not present

## 2021-06-28 DIAGNOSIS — K222 Esophageal obstruction: Secondary | ICD-10-CM | POA: Diagnosis not present

## 2021-06-28 MED ORDER — PANTOPRAZOLE SODIUM 40 MG PO TBEC: 40.0000 mg | DELAYED_RELEASE_TABLET | Freq: Every day | ORAL | 3 refills | Status: DC

## 2021-06-28 NOTE — Progress Notes (Signed)
HISTORY OF PRESENT ILLNESS:  Anthony Lawson is a 56 y.o. male who was evaluated January 05, 2021 regarding worsening intermittent solid food dysphagia.  He subsequently underwent upper endoscopy January 10, 2021.  He was found to have a distal esophageal stricture as well as erosive esophagitis.  No Barrett's.  He was dilated with 54 [place] Maloney dilator.  He was placed on pantoprazole 40 mg daily and presents for follow-up at this time.  Previous colonoscopy December 2021 revealed hyperplastic polyp.  Follow-up in 10 years recommended.  Patient returns for routine follow-up.  He tells me that he is tolerating his PPI.  He never really had classic reflux symptoms.  His swallowing is much better.  He does describe 3 very minor episodes of dysphagia when eating very fast.  Overall he is pleased with the significant improvement in his dysphagia.  No additional GI complaints.  REVIEW OF SYSTEMS:  All non-GI ROS negative unless otherwise stated in the HPI except for skin rash, irregular heartbeat  Past Medical History:  Diagnosis Date   BPH (benign prostatic hyperplasia) 09/29/2015   ED (erectile dysfunction) 09/29/2015   HTN (hypertension) 09/29/2015   Hyperlipidemia 09/29/2015   Multiple dysplastic nevi    Prurigo nodularis 08/22/2017   Injected left lateral lower leg [facility]    Past Surgical History:  Procedure Laterality Date   Dysplastic nevi removal left scapular back April 2019 Left 10/01/2017   [facility]   ROTATOR CUFF REPAIR Left 2012   x 2 - second one in 2022    [place]  reports that he has never smoked. He has never used smokeless tobacco. He reports current alcohol use. He reports that he does not use drugs.  family history includes Bone cancer in his mother; Breast cancer in his mother; Diabetes in his maternal aunt and paternal aunt; Heart disease in his brother, father, paternal aunt, and paternal uncle;  Hypertension in his father, paternal aunt, and paternal uncle; Lung cancer in his maternal grandfather and maternal grandmother.  No Known Allergies     PHYSICAL EXAMINATION: Vital signs: BP (!) 128/34    Pulse 80    Ht 6' (1.829 m)    Wt 243 lb (110.2 kg)    SpO2 97%    BMI 32.96 kg/m   Constitutional: generally well-appearing, no acute distress Psychiatric: alert and oriented x3, cooperative Eyes: Anicteric Mouth: Mask Abdomen: Not reexamined Skin: Normal hue.  No jaundice Neuro: No gross deficits  ASSESSMENT:  1.  GERD with esophagitis and stricture.  Improvement in dysphagia post dilation.  Now on PPI due to the presence of esophagitis. 2.  Normal colonoscopy December 2021   PLAN:  1.  Reflux precautions 2.  Continue pantoprazole 40 mg daily.  Prescribed.  Medication risks reviewed 3.  Routine office follow-up 1 year.  He has been instructed to contact the office in the interim should he have questions or problems with recurrent dysphagia 4.  Repeat screening colonoscopy 2031

## 2021-06-28 NOTE — Patient Instructions (Addendum)
If you are age 56 or older, your body mass index should be between 23-30. Your Body mass index is 32.96 kg/m. If this is out of the aforementioned range listed, please consider follow up with your Primary Care Provider.  If you are age 43 or younger, your body mass index should be between 19-25. Your Body mass index is 32.96 kg/m. If this is out of the aformentioned range listed, please consider follow up with your Primary Care Provider.   ________________________________________________________  The [facility] providers would like to encourage you to use [facility] to communicate with providers for non-urgent requests or questions.  Due to long hold times on the telephone, sending your provider a message by [facility] may be a faster and more efficient way to get a response.  Please allow 48 business hours for a response.  Please remember that this is for non-urgent requests.  _______________________________________________________  We have sent the following medications to your pharmacy for you to pick up at your convenience:   Pantoprazole.  Please follow up one year

## 2021-07-07 ENCOUNTER — Encounter: Payer: Self-pay | Admitting: Family Medicine

## 2021-07-18 ENCOUNTER — Other Ambulatory Visit: Payer: Self-pay

## 2021-07-18 DIAGNOSIS — E782 Mixed hyperlipidemia: Secondary | ICD-10-CM

## 2021-07-18 MED ORDER — SIMVASTATIN 20 MG PO TABS: 20.0000 mg | ORAL_TABLET | Freq: Every day | ORAL | 3 refills | Status: DC

## 2021-07-20 ENCOUNTER — Other Ambulatory Visit: Payer: Self-pay

## 2021-07-20 MED ORDER — PANTOPRAZOLE SODIUM 40 MG PO TBEC: 40.0000 mg | DELAYED_RELEASE_TABLET | Freq: Every day | ORAL | 3 refills | Status: DC

## 2021-07-27 ENCOUNTER — Other Ambulatory Visit: Payer: Self-pay

## 2021-07-27 ENCOUNTER — Encounter: Payer: Self-pay | Admitting: Family Medicine

## 2021-07-27 ENCOUNTER — Ambulatory Visit: Payer: Managed Care, Other (non HMO) | Admitting: Family Medicine

## 2021-07-27 ENCOUNTER — Ambulatory Visit (INDEPENDENT_AMBULATORY_CARE_PROVIDER_SITE_OTHER): Payer: Managed Care, Other (non HMO)

## 2021-07-27 VITALS — BP 131/76 | HR 77 | Temp 97.8°F | Ht 71.0 in | Wt 240.0 lb

## 2021-07-27 DIAGNOSIS — R002 Palpitations: Secondary | ICD-10-CM | POA: Diagnosis not present

## 2021-07-27 NOTE — Assessment & Plan Note (Signed)
EKG is normal today.  Checking BMP and magnesium today.  Echocardiogram and 7-day Zio patch ordered.  We will also have him double up on his PPI for a couple weeks to see if this makes a difference in his symptoms.

## 2021-07-27 NOTE — Progress Notes (Unsigned)
Enrolled patient for a 7 day ZIo XT monitor to be mailed to patients home

## 2021-07-27 NOTE — Patient Instructions (Signed)
Palpitations Palpitations are feelings that your heartbeat is irregular or is faster than normal. It may feel like your heart is fluttering or skipping a beat. Palpitations may be caused by many things, including smoking, caffeine, alcohol, stress, and certain medicines or drugs. Most causes of palpitations are not serious.  However, some palpitations can be a sign of a serious problem. Further tests and a thorough medical history will be done to find the cause of your palpitations. Your provider may order tests such as an ECG, labs, an echocardiogram, or an ambulatory continuous ECG monitor. Follow these instructions at home: Pay attention to any changes in your symptoms. Let your health care provider know about them. Take these actions to help manage your symptoms: Eating and drinking Follow instructions from your health care provider about eating or drinking restrictions. You may need to avoid foods and drinks that may cause palpitations. These may include: Caffeinated coffee, tea, soft drinks, and energy drinks. Chocolate. Alcohol. Diet pills. Lifestyle   Take steps to reduce your stress and anxiety. Things that can help you relax include: Yoga. Mind-body activities, such as deep breathing, meditation, or using words and images to create positive thoughts (guided imagery). Physical activity, such as swimming, jogging, or walking. Tell your health care provider if your palpitations increase with activity. If you have chest pain or shortness of breath with activity, do not continue the activity until you are seen by your health care provider. Biofeedback. This is a method that helps you learn to use your mind to control things in your body, such as your heartbeat. Get plenty of rest and sleep. Keep a regular bed time. Do not use drugs, including cocaine or ecstasy. Do not use marijuana. Do not use any products that contain nicotine or tobacco. These products include cigarettes, chewing tobacco,  and vaping devices, such as e-cigarettes. If you need help quitting, ask your health care provider. General instructions Take over-the-counter and prescription medicines only as told by your health care provider. Keep all follow-up visits. This is important. These may include visits for further testing if palpitations do not go away or get worse. Contact a health care provider if: You continue to have a fast or irregular heartbeat for a long period of time. You notice that your palpitations occur more often. Get help right away if: You have chest pain or shortness of breath. You have a severe headache. You feel dizzy or you faint. These symptoms may represent a serious problem that is an emergency. Do not wait to see if the symptoms will go away. Get medical help right away. Call your local emergency services (911 in the [place] Do not drive yourself to the hospital. Summary Palpitations are feelings that your heartbeat is irregular or is faster than normal. It may feel like your heart is fluttering or skipping a beat. Palpitations may be caused by many things, including smoking, caffeine, alcohol, stress, certain medicines, and drugs. Further tests and a thorough medical history may be done to find the cause of your palpitations. Get help right away if you faint or have chest pain, shortness of breath, severe headache, or dizziness. This information is not intended to replace advice given to you by your health care provider. Make sure you discuss any questions you have with your health care provider. Document Revised: 10/26/2020 Document Reviewed: 10/26/2020 Elsevier Patient Education  [place] [place].

## 2021-07-27 NOTE — Progress Notes (Signed)
Anthony Lawson - 56 y.o. male MRN [id number]  Date of birth: Sep 17, 1965  Subjective Chief Complaint  Patient presents with   Irregular Heart Beat    HPI Anthony Lawson is a 56 year old male here today with complaint of irregular heartbeat/palpitations.  He has noticed this for several weeks, starting around late December.  Reports that this typically occurs in the evening.  Unsure if food or alcohol is contributing.  It has woken him from sleep.  He does feel like his heart is out of rhythm during these episodes and they tend to last for several minutes.  He has not had shortness of breath, nausea, chest pain, fatigue or dizziness associated with this.  ROS:  A comprehensive ROS was completed and negative except as noted per HPI  No Known Allergies  Past Medical History:  Diagnosis Date   BPH (benign prostatic hyperplasia) 09/29/2015   ED (erectile dysfunction) 09/29/2015   HTN (hypertension) 09/29/2015   Hyperlipidemia 09/29/2015   Multiple dysplastic nevi    Prurigo nodularis 08/22/2017   Injected left lateral lower leg [facility]    Past Surgical History:  Procedure Laterality Date   Dysplastic nevi removal left scapular back April 2019 Left 10/01/2017   [facility]   ROTATOR CUFF REPAIR Left 2012   x 2 - second one in 2022    Social History   Socioeconomic History   Marital status: Married    Spouse name: Not on file   Number of children: 0   Years of education: Not on file   Highest education level: Not on file  Occupational History   Occupation: Sales  Tobacco Use   Smoking status: Never   Smokeless tobacco: Never  Vaping Use   Vaping Use: Never used  Substance and Sexual Activity   Alcohol use: Yes    Comment: 6-8 per week   Drug use: No   Sexual activity: Yes  Other Topics Concern   Not on file  Social History Narrative   1 adopted daughter   Social Determinants of Health   Financial Resource Strain: Not on file  Food  Insecurity: Not on file  Transportation Needs: Not on file  Physical Activity: Not on file  Stress: Not on file  Social Connections: Not on file    Family History  Problem Relation Age of Onset   Breast cancer Mother    Bone cancer Mother    Heart disease Father    Hypertension Father    Heart disease Brother    Diabetes Maternal Aunt    Lung cancer Maternal Grandmother    Lung cancer Maternal Grandfather    Hypertension Paternal Aunt        x 2   Heart disease Paternal Aunt        x 2   Diabetes Paternal Aunt    Hypertension Paternal Uncle        x 2   Heart disease Paternal Uncle        x 2    Health Maintenance  Topic Date Due   HIV Screening  Never done   Hepatitis C Screening  Never done   Zoster Vaccines- Shingrix (1 of 2) Never done   COVID-19 Vaccine (4 - Booster for [place] series) 06/27/2020   INFLUENZA VACCINE  09/15/2021 (Originally 01/16/2021)   TETANUS/TDAP  12/03/2022   COLONOSCOPY (Pts 45-58yrs Insurance coverage will need to be confirmed)  06/07/2030   HPV VACCINES  Aged Out     -----------------------------------------------------------------------------------------------------------------------------------------------------------------------------------------------------------------  Physical Exam BP 131/76 (BP Location: Left Arm, Patient Position: Sitting, Cuff Size: Large)    Pulse 77    Temp 97.8 F (36.6 C)    Ht 5\' 11"  (1.803 m)    Wt 240 lb (108.9 kg)    SpO2 97%    BMI 33.47 kg/m   Physical Exam Constitutional:      Appearance: Normal appearance.  Eyes:     General: No scleral icterus. Cardiovascular:     Rate and Rhythm: Normal rate and regular rhythm.  Pulmonary:     Effort: Pulmonary effort is normal.     Breath sounds: Normal breath sounds.  Musculoskeletal:     Cervical back: Neck supple.  Neurological:     General: No focal deficit present.     Mental Status: He is alert.  Psychiatric:        Mood and Affect: Mood normal.         Behavior: Behavior normal.   EKG: Normal sinus rhythm.  No ischemic changes.  PR and QTc intervals are within normal limits. ------------------------------------------------------------------------------------------------------------------------------------------------------------------------------------------------------------------- Assessment and Plan  Palpitations EKG is normal today.  Checking BMP and magnesium today.  Echocardiogram and 7-day Zio patch ordered.  We will also have him double up on his PPI for a couple weeks to see if this makes a difference in his symptoms.   No orders of the defined types were placed in this encounter.   No follow-ups on file.    This visit occurred during the SARS-CoV-2 public health emergency.  Safety protocols were in place, including screening questions prior to the visit, additional usage of staff PPE, and extensive cleaning of exam room while observing appropriate contact time as indicated for disinfecting solutions.

## 2021-07-28 LAB — BASIC METABOLIC PANEL
BUN: 22 mg/dL (ref 7–25)
CO2: 25 mmol/L (ref 20–32)
Calcium: 9.4 mg/dL (ref 8.6–10.3)
Chloride: 104 mmol/L (ref 98–110)
Creat: 0.89 mg/dL (ref 0.70–1.30)
Glucose, Bld: 88 mg/dL (ref 65–99)
Potassium: 4 mmol/L (ref 3.5–5.3)
Sodium: 139 mmol/L (ref 135–146)

## 2021-07-28 LAB — MAGNESIUM: Magnesium: 2.3 mg/dL (ref 1.5–2.5)

## 2021-08-03 NOTE — Progress Notes (Signed)
Subjective:    CC: L knee pain  I, Molly Weber, LAT, ATC, am serving as scribe for Dr. Lynne Leader.  HPI: Pt is a 56 y/o male c/o L knee pain x a few weeks w/ no MOI.  He locates his pain to the anterior/medial aspect of the L knee. Pt attributes the L knee pain to the way his desk chair is sitting.  L knee swelling: no L knee mechanical symptoms: no Aggravating factors: laying on L-side, knee flexion Treatments tried: ice, IBU  Pertinent review of Systems: No fevers or chills  Relevant historical information: History rotator cuff tear.  Hypertension.   Objective:    Vitals:   08/04/21 0834  BP: 140/90  Pulse: 75  SpO2: 95%   General: Well Developed, well nourished, and in no acute distress.   MSK: Left knee normal-appearing Normal motion with crepitation. Mildly tender palpation medial joint line. Stable ligamentous exam. Mildly positive McMurray's test medial. Intact strength.  Lab and Radiology Results  Procedure: Real-time Ultrasound Guided Injection of left knee superior lateral patellar space Device: Philips Affiniti 50G Images permanently stored and available for review in PACS Ultrasound evaluation prior to injection reveals degenerative changes medial joint line with partial extruded medial meniscus. Verbal informed consent obtained.  Discussed risks and benefits of procedure. Warned about infection bleeding damage to structures skin hypopigmentation and fat atrophy among others. Patient expresses understanding and agreement Time-out conducted.   Noted no overlying erythema, induration, or other signs of local infection.   Skin prepped in a sterile fashion.   Local anesthesia: Topical Ethyl chloride.   With sterile technique and under real time ultrasound guidance: 40 mg of Kenalog and 2 mL of Marcaine injected into knee joint. Fluid seen entering the joint capsule.   Completed without difficulty   Pain immediately resolved suggesting accurate  placement of the medication.   Advised to call if fevers/chills, erythema, induration, drainage, or persistent bleeding.   Images permanently stored and available for review in the ultrasound unit.  Impression: Technically successful ultrasound guided injection.   X-ray images left knee obtained today personally and independently interpreted Mild medial compartment DJD.  Loose body present posterior medial knee. No acute fractures are visible. Await formal radiology review    Impression and Recommendations:    Assessment and Plan: 56 y.o. male with left knee pain thought to be due to DJD and likely degenerative medial meniscus tear.  Plan for steroid injection today and Voltaren gel.  Also recommend quad strengthening and weight loss.  Recheck back as needed.  Would consider hyaluronic acid injections or even MRI if needed in the future.  Formal physical therapy to really focus on quad strengthening could also be helpful.Marland Kitchen  PDMP not reviewed this encounter. Orders Placed This Encounter  Procedures   DG Knee AP/LAT W/Sunrise Left    Standing Status:   Future    Number of Occurrences:   1    Standing Expiration Date:   08/04/2022    Order Specific Question:   Reason for Exam (SYMPTOM  OR DIAGNOSIS REQUIRED)    Answer:   left knee pain    Order Specific Question:   Preferred imaging location?    Answer:   [facility]   [place] LIMITED JOINT SPACE STRUCTURES LOW LEFT(NO LINKED CHARGES)    Standing Status:   Future    Number of Occurrences:   1    Standing Expiration Date:   02/01/2022    Order Specific  Question:   Reason for Exam (SYMPTOM  OR DIAGNOSIS REQUIRED)    Answer:   left knee pain    Order Specific Question:   Preferred imaging location?    Answer:   [facility]   No orders of the defined types were placed in this encounter.   Discussed warning signs or symptoms. Please see discharge instructions. Patient expresses understanding.   The above  documentation has been reviewed and is accurate and complete Lynne Leader, M.D.

## 2021-08-04 ENCOUNTER — Other Ambulatory Visit: Payer: Self-pay

## 2021-08-04 ENCOUNTER — Ambulatory Visit (INDEPENDENT_AMBULATORY_CARE_PROVIDER_SITE_OTHER): Payer: Managed Care, Other (non HMO)

## 2021-08-04 ENCOUNTER — Ambulatory Visit (INDEPENDENT_AMBULATORY_CARE_PROVIDER_SITE_OTHER): Payer: Managed Care, Other (non HMO) | Admitting: Family Medicine

## 2021-08-04 ENCOUNTER — Ambulatory Visit: Payer: Self-pay

## 2021-08-04 VITALS — BP 140/90 | HR 75 | Ht 71.0 in | Wt 243.0 lb

## 2021-08-04 DIAGNOSIS — M1712 Unilateral primary osteoarthritis, left knee: Secondary | ICD-10-CM

## 2021-08-04 DIAGNOSIS — G8929 Other chronic pain: Secondary | ICD-10-CM | POA: Diagnosis not present

## 2021-08-04 DIAGNOSIS — M25562 Pain in left knee: Secondary | ICD-10-CM

## 2021-08-04 DIAGNOSIS — R002 Palpitations: Secondary | ICD-10-CM

## 2021-08-04 NOTE — Patient Instructions (Signed)
Thank you for coming in today.   Please use Voltaren gel (Generic Diclofenac Gel) up to 4x daily for pain as needed.  This is available over-the-counter as both the name brand Voltaren gel and the generic diclofenac gel.   Recheck as needed.

## 2021-08-07 ENCOUNTER — Encounter: Payer: Self-pay | Admitting: Family Medicine

## 2021-08-07 NOTE — Progress Notes (Signed)
Left knee x-ray shows medium arthritis and a loose body at the back of the knee.

## 2021-08-08 ENCOUNTER — Encounter: Payer: Self-pay | Admitting: Family Medicine

## 2021-08-16 ENCOUNTER — Other Ambulatory Visit: Payer: Self-pay

## 2021-08-16 ENCOUNTER — Ambulatory Visit (HOSPITAL_COMMUNITY): Payer: Managed Care, Other (non HMO) | Attending: Cardiology

## 2021-08-16 DIAGNOSIS — R002 Palpitations: Secondary | ICD-10-CM | POA: Insufficient documentation

## 2021-08-16 LAB — ECHOCARDIOGRAM COMPLETE
Area-P 1/2: 3.3 cm2
S' Lateral: 2.2 cm

## 2021-10-01 ENCOUNTER — Encounter: Payer: Self-pay | Admitting: Family Medicine

## 2022-02-09 ENCOUNTER — Encounter: Payer: Self-pay | Admitting: Family Medicine

## 2022-02-09 DIAGNOSIS — E782 Mixed hyperlipidemia: Secondary | ICD-10-CM

## 2022-02-09 DIAGNOSIS — Z8249 Family history of ischemic heart disease and other diseases of the circulatory system: Secondary | ICD-10-CM

## 2022-03-13 ENCOUNTER — Encounter: Payer: Self-pay | Admitting: Family Medicine

## 2022-03-14 ENCOUNTER — Telehealth (INDEPENDENT_AMBULATORY_CARE_PROVIDER_SITE_OTHER): Payer: Managed Care, Other (non HMO) | Admitting: Family Medicine

## 2022-03-14 ENCOUNTER — Encounter: Payer: Self-pay | Admitting: Family Medicine

## 2022-03-14 DIAGNOSIS — U071 COVID-19: Secondary | ICD-10-CM | POA: Insufficient documentation

## 2022-03-14 MED ORDER — NIRMATRELVIR/RITONAVIR (PAXLOVID)TABLET: 3.0000 | ORAL_TABLET | Freq: Two times a day (BID) | ORAL | 0 refills | Status: AC

## 2022-03-14 NOTE — Assessment & Plan Note (Signed)
Symptoms improved today compared to yesterday.  Recommend continued supportive care with increased fluids and rest.  Paxlovid discussed and he would like this sent in.  He will consider starting if symptoms do worsen.  Instructed to hold simvastatin while taking paxlovid.  He will let us know if symptoms worsen significantly.

## 2022-03-14 NOTE — Progress Notes (Signed)
Symptoms started Sunday. Tested yesterday.  Congestion, scratchy throat, runny nose, cough  Meds: Dayquil, Nyquil

## 2022-03-14 NOTE — Progress Notes (Signed)
Anthony Lawson - 56 y.o. male MRN [id number]  Date of birth: Jul 08, 1965   This visit type was conducted due to national recommendations for restrictions regarding the COVID-19 Pandemic (e.g. social distancing).  This format is felt to be most appropriate for this patient at this time.  All issues noted in this document were discussed and addressed.  No physical exam was performed (except for noted visual exam findings with Video Visits).  I discussed the limitations of evaluation and management by telemedicine and the availability of in person appointments. The patient expressed understanding and agreed to proceed.  I connected withNAME@ on 03/14/22 at  1:30 PM EDT by a video enabled telemedicine application and verified that I am speaking with the correct person using two identifiers.  Present at visit: Anthony Nutting, DO [employer]   Patient Location: Home [address] [place] [address]   Provider location:   [place]  No chief complaint on file.   HPI  Anthony Lawson is a 56 y.o. male who presents via Engineer, civil (consulting) for a telehealth visit today.  Reports having cough, congestion, fatigue, body aches and low grade fever for about 3 days.  Tested positive for COVID yesterday.  Feels a little better today compared to yesterday.  Denies increased cough, shortness of breath ,nausea, vomiting, diarrhea. Appetite is ok and he is drinking plenty of fluids. He does get some relief with dayquil/nyquil.    ROS:  A comprehensive ROS was completed and negative except as noted per HPI  Past Medical History:  Diagnosis Date   BPH (benign prostatic hyperplasia) 09/29/2015   ED (erectile dysfunction) 09/29/2015   HTN (hypertension) 09/29/2015   Hyperlipidemia 09/29/2015   Multiple dysplastic nevi    Prurigo nodularis 08/22/2017   Injected left lateral lower leg [facility]    Past Surgical History:  Procedure Laterality Date   Dysplastic nevi  removal left scapular back April 2019 Left 10/01/2017   [facility]   ROTATOR CUFF REPAIR Left 2012   x 2 - second one in 2022    Family History  Problem Relation Age of Onset   Breast cancer Mother    Bone cancer Mother    Heart disease Father    Hypertension Father    Heart disease Brother    Diabetes Maternal Aunt    Lung cancer Maternal Grandmother    Lung cancer Maternal Grandfather    Hypertension Paternal Aunt        x 2   Heart disease Paternal Aunt        x 2   Diabetes Paternal Aunt    Hypertension Paternal Uncle        x 2   Heart disease Paternal Uncle        x 2    Social History   Socioeconomic History   Marital status: Married    Spouse name: Not on file   Number of children: 0   Years of education: Not on file   Highest education level: Not on file  Occupational History   Occupation: Sales  Tobacco Use   Smoking status: Never   Smokeless tobacco: Never  Vaping Use   Vaping Use: Never used  Substance and Sexual Activity   Alcohol use: Yes    Comment: 6-8 per week   Drug use: No   Sexual activity: Yes  Other Topics Concern   Not on file  Social History Narrative   1 adopted daughter  Social Determinants of Health   Financial Resource Strain: Not on file  Food Insecurity: Not on file  Transportation Needs: Not on file  Physical Activity: Not on file  Stress: Not on file  Social Connections: Not on file  Intimate Partner Violence: Not on file     Current Outpatient Medications:    fluticasone (FLONASE) 50 MCG/ACT nasal spray, Place 1 spray into both nostrils as needed for allergies or rhinitis., Disp: , Rfl:    lisinopril (ZESTRIL) 10 MG tablet, Take 1 tablet by mouth once daily, Disp: 90 tablet, Rfl: 3   Multiple Vitamin (MULTIVITAMIN) tablet, Take 1 tablet by mouth daily., Disp: , Rfl:    nirmatrelvir/ritonavir EUA (PAXLOVID) 20 x 150 MG & 10 x '100MG'$  TABS, Take 3 tablets by mouth 2 (two) times daily for 5 days. (Take  nirmatrelvir 150 mg two tablets twice daily for 5 days and ritonavir 100 mg one tablet twice daily for 5 days) Patient GFR is 105.  Hold simvastatin while taking., Disp: 30 tablet, Rfl: 0   pantoprazole (PROTONIX) 40 MG tablet, Take 1 tablet (40 mg total) by mouth daily., Disp: 90 tablet, Rfl: 3   simvastatin (ZOCOR) 20 MG tablet, Take 1 tablet (20 mg total) by mouth daily., Disp: 90 tablet, Rfl: 3   tamsulosin (FLOMAX) 0.4 MG CAPS capsule, Take 1 capsule by mouth once daily, Disp: 90 capsule, Rfl: 0   triamcinolone cream (KENALOG) 0.1 %, Apply topically., Disp: , Rfl:   EXAM:  VITALS per patient if applicable: Ht '5\' 11"'$  (1.803 m)   Wt 243 lb (110.2 kg)   BMI 33.89 kg/m   GENERAL: alert, oriented, appears well and in no acute distress  HEENT: atraumatic, conjunttiva clear, no obvious abnormalities on inspection of external nose and ears  NECK: normal movements of the head and neck  LUNGS: on inspection no signs of respiratory distress, breathing rate appears normal, no obvious gross SOB, gasping or wheezing  CV: no obvious cyanosis  MS: moves all visible extremities without noticeable abnormality  PSYCH/NEURO: pleasant and cooperative, no obvious depression or anxiety, speech and thought processing grossly intact  ASSESSMENT AND PLAN:  Discussed the following assessment and plan:  COVID-19 Symptoms improved today compared to yesterday.  Recommend continued supportive care with increased fluids and rest.  Paxlovid discussed and he would like this sent in.  He will consider starting if symptoms do worsen.  Instructed to hold simvastatin while taking paxlovid.  He will let us know if symptoms worsen significantly.       I discussed the assessment and treatment plan with the patient. The patient was provided an opportunity to ask questions and all were answered. The patient agreed with the plan and demonstrated an understanding of the instructions.   The patient was advised to call  back or seek an in-person evaluation if the symptoms worsen or if the condition fails to improve as anticipated.    Anthony Nutting, DO

## 2022-03-14 NOTE — Telephone Encounter (Signed)
Patient scheduled.

## 2022-03-18 ENCOUNTER — Other Ambulatory Visit: Payer: Self-pay | Admitting: Family Medicine

## 2022-03-18 DIAGNOSIS — N401 Enlarged prostate with lower urinary tract symptoms: Secondary | ICD-10-CM

## 2022-03-27 ENCOUNTER — Other Ambulatory Visit: Payer: Self-pay | Admitting: Family Medicine

## 2022-03-27 DIAGNOSIS — I10 Essential (primary) hypertension: Secondary | ICD-10-CM

## 2022-03-28 NOTE — Telephone Encounter (Signed)
Please contact the patient to schedule  HTN follow-up appt. Sending 30 day medication refill. Thanks

## 2022-03-28 NOTE — Telephone Encounter (Signed)
I called pt and left a voicemail to schedule an appointment with Dr.Matthews to f/u on his BP

## 2022-04-13 ENCOUNTER — Telehealth: Payer: Managed Care, Other (non HMO) | Admitting: Family Medicine

## 2022-04-17 ENCOUNTER — Telehealth: Payer: Managed Care, Other (non HMO) | Admitting: Family Medicine

## 2022-04-24 ENCOUNTER — Ambulatory Visit (INDEPENDENT_AMBULATORY_CARE_PROVIDER_SITE_OTHER): Payer: Managed Care, Other (non HMO) | Admitting: Family Medicine

## 2022-04-24 ENCOUNTER — Encounter: Payer: Self-pay | Admitting: Family Medicine

## 2022-04-24 VITALS — BP 131/79 | HR 75 | Ht 71.0 in | Wt 243.0 lb

## 2022-04-24 DIAGNOSIS — I10 Essential (primary) hypertension: Secondary | ICD-10-CM | POA: Diagnosis not present

## 2022-04-24 DIAGNOSIS — E782 Mixed hyperlipidemia: Secondary | ICD-10-CM

## 2022-04-24 DIAGNOSIS — Z23 Encounter for immunization: Secondary | ICD-10-CM | POA: Diagnosis not present

## 2022-04-24 DIAGNOSIS — Z0000 Encounter for general adult medical examination without abnormal findings: Secondary | ICD-10-CM | POA: Diagnosis not present

## 2022-04-24 DIAGNOSIS — Z125 Encounter for screening for malignant neoplasm of prostate: Secondary | ICD-10-CM

## 2022-04-24 MED ORDER — CYCLOBENZAPRINE HCL 10 MG PO TABS: 10.0000 mg | ORAL_TABLET | Freq: Three times a day (TID) | ORAL | 0 refills | Status: DC | PRN

## 2022-04-24 NOTE — Assessment & Plan Note (Signed)
Well adult Orders Placed This Encounter  Procedures   Flu Vaccine QUAD 6+ mos PF IM (Fluarix Quad PF)   COMPLETE METABOLIC PANEL WITH GFR   CBC with Differential   Lipid Panel w/reflex Direct LDL   PSA   TSH  Screenings: Per lab orders Immunizations: Flu vaccine given today. Anticipatory guidance/risk factor reduction: Recommendations per AVS.

## 2022-04-24 NOTE — Progress Notes (Signed)
Anthony Lawson - 56 y.o. male MRN [id number]  Date of birth: 08/14/1965  Subjective Chief Complaint  Patient presents with   Annual Exam    HPI Anthony Lawson is a 57 y.o. male here today for annual exam.  Reports overall he is doing well.  Has had some back spasms that began this morning.  He has had these in the past.  He continues to stay pretty active.  Weight is down some since last visit.  He is following a healthier diet.  He does have regular dental care.  He is a non-smoker.  He does consume alcohol occasionally.  Due for flu vaccine today.  Review of Systems  Constitutional:  Negative for chills, fever, malaise/fatigue and weight loss.  HENT:  Negative for congestion, ear pain and sore throat.   Eyes:  Negative for blurred vision, double vision and pain.  Respiratory:  Negative for cough and shortness of breath.   Cardiovascular:  Negative for chest pain and palpitations.  Gastrointestinal:  Negative for abdominal pain, blood in stool, constipation, heartburn and nausea.  Genitourinary:  Negative for dysuria and urgency.  Musculoskeletal:  Negative for joint pain and myalgias.  Neurological:  Negative for dizziness and headaches.  Endo/Heme/Allergies:  Does not bruise/bleed easily.  Psychiatric/Behavioral:  Negative for depression. The patient is not nervous/anxious and does not have insomnia.      No Known Allergies  Past Medical History:  Diagnosis Date   BPH (benign prostatic hyperplasia) 09/29/2015   ED (erectile dysfunction) 09/29/2015   HTN (hypertension) 09/29/2015   Hyperlipidemia 09/29/2015   Multiple dysplastic nevi    Prurigo nodularis 08/22/2017   Injected left lateral lower leg [facility]    Past Surgical History:  Procedure Laterality Date   Dysplastic nevi removal left scapular back April 2019 Left 10/01/2017   [facility]   ROTATOR CUFF REPAIR Left 2012   x 2 - second one in 2022    Social History    Socioeconomic History   Marital status: Married    Spouse name: Not on file   Number of children: 0   Years of education: Not on file   Highest education level: Not on file  Occupational History   Occupation: Sales  Tobacco Use   Smoking status: Never   Smokeless tobacco: Never  Vaping Use   Vaping Use: Never used  Substance and Sexual Activity   Alcohol use: Yes    Comment: 6-8 per week   Drug use: No   Sexual activity: Yes  Other Topics Concern   Not on file  Social History Narrative   1 adopted daughter   Social Determinants of Health   Financial Resource Strain: Not on file  Food Insecurity: Not on file  Transportation Needs: Not on file  Physical Activity: Not on file  Stress: Not on file  Social Connections: Not on file    Family History  Problem Relation Age of Onset   Breast cancer Mother    Bone cancer Mother    Heart disease Father    Hypertension Father    Heart disease Brother    Diabetes Maternal Aunt    Lung cancer Maternal Grandmother    Lung cancer Maternal Grandfather    Hypertension Paternal Aunt        x 2   Heart disease Paternal Aunt        x 2   Diabetes Paternal Aunt    Hypertension Paternal Uncle  x 2   Heart disease Paternal Uncle        x 2    Health Maintenance  Topic Date Due   COVID-19 Vaccine (4 - Pfizer series) 07/19/2022 (Originally 06/27/2020)   Zoster Vaccines- Shingrix (1 of 2) 07/19/2022 (Originally 12/21/2015)   Hepatitis C Screening  03/15/2023 (Originally 12/21/1983)   HIV Screening  03/15/2023 (Originally 12/20/1980)   TETANUS/TDAP  12/03/2022   COLONOSCOPY (Pts 45-14yr Insurance coverage will need to be confirmed)  06/07/2030   INFLUENZA VACCINE  Completed   HPV VACCINES  Aged Out     ----------------------------------------------------------------------------------------------------------------------------------------------------------------------------------------------------------------- Physical  Exam BP 131/79 (BP Location: Left Arm, Patient Position: Sitting, Cuff Size: Large)   Pulse 75   Ht '5\' 11"'$  (1.803 m)   Wt 243 lb (110.2 kg)   SpO2 98%   BMI 33.89 kg/m   Physical Exam Constitutional:      General: He is not in acute distress. HENT:     Head: Normocephalic and atraumatic.     Right Ear: Tympanic membrane and external ear normal.     Left Ear: Tympanic membrane and external ear normal.  Eyes:     General: No scleral icterus. Neck:     Thyroid: No thyromegaly.  Cardiovascular:     Rate and Rhythm: Normal rate and regular rhythm.     Heart sounds: Normal heart sounds.  Pulmonary:     Effort: Pulmonary effort is normal.     Breath sounds: Normal breath sounds.  Abdominal:     General: Bowel sounds are normal. There is no distension.     Palpations: Abdomen is soft.     Tenderness: There is no abdominal tenderness. There is no guarding.  Musculoskeletal:     Cervical back: Normal range of motion.  Lymphadenopathy:     Cervical: No cervical adenopathy.  Skin:    General: Skin is warm and dry.     Findings: No rash.  Neurological:     Mental Status: He is alert and oriented to person, place, and time.     Cranial Nerves: No cranial nerve deficit.     Motor: No abnormal muscle tone.  Psychiatric:        Mood and Affect: Mood normal.        Behavior: Behavior normal.     ------------------------------------------------------------------------------------------------------------------------------------------------------------------------------------------------------------------- Assessment and Plan  Well adult exam Well adult Orders Placed This Encounter  Procedures   Flu Vaccine QUAD 6+ mos PF IM (Fluarix Quad PF)   COMPLETE METABOLIC PANEL WITH GFR   CBC with Differential   Lipid Panel w/reflex Direct LDL   PSA   TSH  Screenings: Per lab orders Immunizations: Flu vaccine given today. Anticipatory guidance/risk factor reduction: Recommendations  per AVS.   No orders of the defined types were placed in this encounter.   No follow-ups on file.    This visit occurred during the SARS-CoV-2 public health emergency.  Safety protocols were in place, including screening questions prior to the visit, additional usage of staff PPE, and extensive cleaning of exam room while observing appropriate contact time as indicated for disinfecting solutions.

## 2022-04-24 NOTE — Patient Instructions (Signed)

## 2022-04-25 LAB — CBC WITH DIFFERENTIAL/PLATELET
Absolute Monocytes: 435 cells/uL (ref 200–950)
Basophils Absolute: 60 cells/uL (ref 0–200)
Basophils Relative: 1.2 %
Eosinophils Absolute: 130 cells/uL (ref 15–500)
Eosinophils Relative: 2.6 %
HCT: 46.9 % (ref 38.5–50.0)
Hemoglobin: 16 g/dL (ref 13.2–17.1)
Lymphs Abs: 1205 cells/uL (ref 850–3900)
MCH: 30 pg (ref 27.0–33.0)
MCHC: 34.1 g/dL (ref 32.0–36.0)
MCV: 87.8 fL (ref 80.0–100.0)
MPV: 10.1 fL (ref 7.5–12.5)
Monocytes Relative: 8.7 %
Neutro Abs: 3170 cells/uL (ref 1500–7800)
Neutrophils Relative %: 63.4 %
Platelets: 250 10*3/uL (ref 140–400)
RBC: 5.34 10*6/uL (ref 4.20–5.80)
RDW: 12.7 % (ref 11.0–15.0)
Total Lymphocyte: 24.1 %
WBC: 5 10*3/uL (ref 3.8–10.8)

## 2022-04-25 LAB — LIPID PANEL W/REFLEX DIRECT LDL
Cholesterol: 172 mg/dL (ref <200)
HDL: 42 mg/dL (ref >=40)
LDL Cholesterol (Calc): 103 mg/dL (calc) — ABNORMAL HIGH
Non-HDL Cholesterol (Calc): 130 mg/dL (calc) — ABNORMAL HIGH (ref <130)
Total CHOL/HDL Ratio: 4.1 (calc) (ref <5.0)
Triglycerides: 159 mg/dL — ABNORMAL HIGH (ref <150)

## 2022-04-25 LAB — COMPLETE METABOLIC PANEL WITH GFR
AG Ratio: 1.7 (calc) (ref 1.0–2.5)
ALT: 29 U/L (ref 9–46)
AST: 20 U/L (ref 10–35)
Albumin: 4.5 g/dL (ref 3.6–5.1)
Alkaline phosphatase (APISO): 68 U/L (ref 35–144)
BUN: 13 mg/dL (ref 7–25)
CO2: 27 mmol/L (ref 20–32)
Calcium: 9.4 mg/dL (ref 8.6–10.3)
Chloride: 104 mmol/L (ref 98–110)
Creat: 0.83 mg/dL (ref 0.70–1.30)
Globulin: 2.6 g/dL (calc) (ref 1.9–3.7)
Glucose, Bld: 90 mg/dL (ref 65–99)
Potassium: 4.5 mmol/L (ref 3.5–5.3)
Sodium: 139 mmol/L (ref 135–146)
Total Bilirubin: 0.6 mg/dL (ref 0.2–1.2)
Total Protein: 7.1 g/dL (ref 6.1–8.1)
eGFR: 103 mL/min/{1.73_m2} (ref >=60)

## 2022-04-25 LAB — TSH: TSH: 1.82 mIU/L (ref 0.40–4.50)

## 2022-04-25 LAB — PSA: PSA: 2.69 ng/mL (ref <=4.00)

## 2022-05-14 ENCOUNTER — Other Ambulatory Visit: Payer: Self-pay | Admitting: Family Medicine

## 2022-05-14 DIAGNOSIS — I10 Essential (primary) hypertension: Secondary | ICD-10-CM

## 2022-06-08 ENCOUNTER — Telehealth: Payer: Managed Care, Other (non HMO) | Admitting: Physician Assistant

## 2022-06-08 DIAGNOSIS — J069 Acute upper respiratory infection, unspecified: Secondary | ICD-10-CM

## 2022-06-08 DIAGNOSIS — H109 Unspecified conjunctivitis: Secondary | ICD-10-CM

## 2022-06-08 MED ORDER — POLYMYXIN B-TRIMETHOPRIM 10000-0.1 UNIT/ML-% OP SOLN: 1.0000 [drp] | OPHTHALMIC | 0 refills | Status: AC

## 2022-06-08 MED ORDER — BENZONATATE 100 MG PO CAPS: 100.0000 mg | ORAL_CAPSULE | Freq: Three times a day (TID) | ORAL | 0 refills | Status: AC | PRN

## 2022-06-08 MED ORDER — FLUTICASONE PROPIONATE 50 MCG/ACT NA SUSP: 2.0000 | Freq: Every day | NASAL | 0 refills | Status: AC

## 2022-06-08 NOTE — Progress Notes (Signed)
E-Visit for Upper Respiratory Infection   We are sorry you are not feeling well.  Here is how we plan to help!  Based on what you have shared with me, it looks like you may have a viral upper respiratory infection.  Upper respiratory infections are caused by a large number of viruses; however, rhinovirus is the most common cause.   Symptoms vary from person to person, with common symptoms including sore throat, cough, fatigue or lack of energy and feeling of general discomfort.  A low-grade fever of up to 100.4 may present, but is often uncommon.  Symptoms vary however, and are closely related to a person's age or underlying illnesses.  The most common symptoms associated with an upper respiratory infection are nasal discharge or congestion, cough, sneezing, headache and pressure in the ears and face.  These symptoms usually persist for about 3 to 10 days, but can last up to 2 weeks.  It is important to know that upper respiratory infections do not cause serious illness or complications in most cases.    Upper respiratory infections can be transmitted from person to person, with the most common method of transmission being a person's hands.  The virus is able to live on the skin and can infect other persons for up to 2 hours after direct contact.  Also, these can be transmitted when someone coughs or sneezes; thus, it is important to cover the mouth to reduce this risk.  To keep the spread of the illness at [place], good hand hygiene is very important.  This is an infection that is most likely caused by a virus. There are no specific treatments other than to help you with the symptoms until the infection runs its course.  We are sorry you are not feeling well.  Here is how we plan to help!   For nasal congestion, you may use an oral decongestants such as Mucinex D or if you have glaucoma or high blood pressure use plain Mucinex.  Saline nasal spray or nasal drops can help and can safely be used as often as  needed for congestion.  For your congestion, I have prescribed Fluticasone nasal spray one spray in each nostril twice a day  If you do not have a history of heart disease, hypertension, diabetes or thyroid disease, prostate/bladder issues or glaucoma, you may also use Sudafed to treat nasal congestion.  It is highly recommended that you consult with a pharmacist or your primary care physician to ensure this medication is safe for you to take.     If you have a cough, you may use cough suppressants such as Delsym and Robitussin.  If you have glaucoma or high blood pressure, you can also use Coricidin HBP.   For cough I have prescribed for you A prescription cough medication called Tessalon Perles 100 mg. You may take 1-2 capsules every 8 hours as needed for cough  I have prescribed Polytrim eye drops, place 1 drop in affected eye every 4 hours while awake for 5 days.  If you have a sore or scratchy throat, use a saltwater gargle-  to  teaspoon of salt dissolved in a 4-ounce to 8-ounce glass of warm water.  Gargle the solution for approximately 15-30 seconds and then spit.  It is important not to swallow the solution.  You can also use throat lozenges/cough drops and Chloraseptic spray to help with throat pain or discomfort.  Warm or cold liquids can also be helpful in relieving throat  pain.  For headache, pain or general discomfort, you can use Ibuprofen or Tylenol as directed.   Some authorities believe that zinc sprays or the use of Echinacea may shorten the course of your symptoms.   HOME CARE Only take medications as instructed by your medical team. Be sure to drink plenty of fluids. Water is fine as well as fruit juices, sodas and electrolyte beverages. You may want to stay away from caffeine or alcohol. If you are nauseated, try taking small sips of liquids. How do you know if you are getting enough fluid? Your urine should be a pale yellow or almost colorless. Get rest. Taking a steamy  shower or using a humidifier may help nasal congestion and ease sore throat pain. You can place a towel over your head and breathe in the steam from hot water coming from a faucet. Using a saline nasal spray works much the same way. Cough drops, hard candies and sore throat lozenges may ease your cough. Avoid close contacts especially the very young and the elderly Cover your mouth if you cough or sneeze Always remember to wash your hands.   GET HELP RIGHT AWAY IF: You develop worsening fever. If your symptoms do not improve within 10 days You develop yellow or green discharge from your nose over 3 days. You have coughing fits You develop a severe head ache or visual changes. You develop shortness of breath, difficulty breathing or start having chest pain Your symptoms persist after you have completed your treatment plan  MAKE SURE YOU  Understand these instructions. Will watch your condition. Will get help right away if you are not doing well or get worse.  Thank you for choosing an e-visit.  Your e-visit answers were reviewed by a board certified advanced clinical practitioner to complete your personal care plan. Depending upon the condition, your plan could have included both over the counter or prescription medications.  Please review your pharmacy choice. Make sure the pharmacy is open so you can pick up prescription now. If there is a problem, you may contact your provider through [employer] and have the prescription routed to another pharmacy.  Your safety is important to [place]. If you have drug allergies check your prescription carefully.   For the next 24 hours you can use MyChart to ask questions about today's visit, request a non-urgent call back, or ask for a work or school excuse. You will get an email in the next two days asking about your experience. I hope that your e-visit has been valuable and will speed your recovery.  I have spent 5 minutes in review of e-visit  questionnaire, review and updating patient chart, medical decision making and response to patient.   Mar Daring, PA-C

## 2022-06-22 ENCOUNTER — Other Ambulatory Visit: Payer: Self-pay | Admitting: Family Medicine

## 2022-06-22 ENCOUNTER — Encounter: Payer: Self-pay | Admitting: Family Medicine

## 2022-06-22 DIAGNOSIS — N401 Enlarged prostate with lower urinary tract symptoms: Secondary | ICD-10-CM

## 2022-06-22 MED ORDER — TADALAFIL 5 MG PO TABS: 5.0000 mg | ORAL_TABLET | Freq: Every day | ORAL | 3 refills | Status: DC

## 2022-06-28 ENCOUNTER — Other Ambulatory Visit: Payer: Self-pay | Admitting: Family Medicine

## 2022-06-28 DIAGNOSIS — N401 Enlarged prostate with lower urinary tract symptoms: Secondary | ICD-10-CM

## 2022-06-28 MED ORDER — TAMSULOSIN HCL 0.4 MG PO CAPS: 0.4000 mg | ORAL_CAPSULE | Freq: Every day | ORAL | 0 refills | Status: DC

## 2022-06-28 MED ORDER — OSELTAMIVIR PHOSPHATE 75 MG PO CAPS: 75.0000 mg | ORAL_CAPSULE | Freq: Every day | ORAL | 0 refills | Status: AC

## 2022-09-03 ENCOUNTER — Other Ambulatory Visit: Payer: Self-pay | Admitting: Family Medicine

## 2022-09-03 DIAGNOSIS — E782 Mixed hyperlipidemia: Secondary | ICD-10-CM

## 2022-09-12 ENCOUNTER — Other Ambulatory Visit: Payer: Self-pay | Admitting: Internal Medicine

## 2022-09-16 ENCOUNTER — Other Ambulatory Visit: Payer: Self-pay | Admitting: Family Medicine

## 2022-09-16 DIAGNOSIS — N401 Enlarged prostate with lower urinary tract symptoms: Secondary | ICD-10-CM

## 2022-10-15 ENCOUNTER — Encounter: Payer: Self-pay | Admitting: Family Medicine

## 2022-11-08 ENCOUNTER — Other Ambulatory Visit: Payer: Self-pay | Admitting: Family Medicine

## 2022-11-08 DIAGNOSIS — I10 Essential (primary) hypertension: Secondary | ICD-10-CM

## 2022-11-08 NOTE — Telephone Encounter (Signed)
Pls contact the pt to schedule past due 6-mth HTN follow-up. Sending 30 day refill. Thanks

## 2022-11-08 NOTE — Telephone Encounter (Signed)
Called spoke with patient he is scheduled for Friday May 31st at 10:10

## 2022-11-16 ENCOUNTER — Ambulatory Visit: Payer: Managed Care, Other (non HMO) | Admitting: Family Medicine

## 2022-11-16 ENCOUNTER — Ambulatory Visit (INDEPENDENT_AMBULATORY_CARE_PROVIDER_SITE_OTHER): Payer: Managed Care, Other (non HMO)

## 2022-11-16 ENCOUNTER — Encounter: Payer: Self-pay | Admitting: Family Medicine

## 2022-11-16 VITALS — BP 139/87 | HR 50 | Ht 71.0 in | Wt 240.0 lb

## 2022-11-16 DIAGNOSIS — R002 Palpitations: Secondary | ICD-10-CM | POA: Diagnosis not present

## 2022-11-16 DIAGNOSIS — I10 Essential (primary) hypertension: Secondary | ICD-10-CM | POA: Diagnosis not present

## 2022-11-16 DIAGNOSIS — M25621 Stiffness of right elbow, not elsewhere classified: Secondary | ICD-10-CM

## 2022-11-16 DIAGNOSIS — R252 Cramp and spasm: Secondary | ICD-10-CM | POA: Insufficient documentation

## 2022-11-16 MED ORDER — LISINOPRIL 10 MG PO TABS: 10.0000 mg | ORAL_TABLET | Freq: Every day | ORAL | 3 refills | Status: DC

## 2022-11-16 NOTE — Assessment & Plan Note (Addendum)
Orders Placed This Encounter  Procedures   COMPLETE METABOLIC PANEL WITH GFR   Magnesium   B12   HgB A1c   Iron, TIBC and Ferritin Panel  Checking labs.  Doesn't really have any other symptoms suggestive of CTS.

## 2022-11-16 NOTE — Assessment & Plan Note (Signed)
BP remains fairly well controlled. Continue lisinopril at current strength.

## 2022-11-16 NOTE — Assessment & Plan Note (Signed)
Still with intermittent episodes. Nothing captured on zio patch previously.

## 2022-11-16 NOTE — Progress Notes (Signed)
Anthony Lawson - 57 y.o. male MRN [id number]  Date of birth: 05-29-1966  Subjective Chief Complaint  Patient presents with   Follow-up    HPI Anthony Lawson is a 57 y.o. male here today for follow up.   Overall doing well. He has noted some hand cramping.  He notices this typically when holding a fork to eat. Occasionally when playing a guitar.  He has had this off and on for sometime.   Denies numbness or tingling associated with this.    BP remains pretty well controlled with lisinopril 10mg  daily.  He denies side effects of medication at current strength.  He has not had chest pain, shortness of breath, palpitations, headache or vision changes.   Tolerating simvastatin well at current strength.   ROS:  A comprehensive ROS was completed and negative except as noted per HPI  No Known Allergies  Past Medical History:  Diagnosis Date   BPH (benign prostatic hyperplasia) 09/29/2015   ED (erectile dysfunction) 09/29/2015   HTN (hypertension) 09/29/2015   Hyperlipidemia 09/29/2015   Multiple dysplastic nevi    Prurigo nodularis 08/22/2017   Injected left lateral lower leg [facility]    Past Surgical History:  Procedure Laterality Date   Dysplastic nevi removal left scapular back April 2019 Left 10/01/2017   [facility]   ROTATOR CUFF REPAIR Left 2012   x 2 - second one in 2022    Social History   Socioeconomic History   Marital status: Married    Spouse name: Not on file   Number of children: 0   Years of education: Not on file   Highest education level: Bachelor's degree (e.g., BA, AB, BS)  Occupational History   Occupation: Sales  Tobacco Use   Smoking status: Never   Smokeless tobacco: Never  Vaping Use   Vaping Use: Never used  Substance and Sexual Activity   Alcohol use: Yes    Comment: 6-8 per week   Drug use: No   Sexual activity: Yes  Other Topics Concern   Not on file  Social History Narrative   1 adopted  daughter   Social Determinants of Health   Financial Resource Strain: Low Risk  (11/13/2022)   Overall Financial Resource Strain (CARDIA)    Difficulty of Paying Living Expenses: Not hard at all  Food Insecurity: No Food Insecurity (11/13/2022)   Hunger Vital Sign    Worried About Running Out of Food in the Last Year: Never true    Ran Out of Food in the Last Year: Never true  Transportation Needs: No Transportation Needs (11/13/2022)   PRAPARE - Administrator, Civil Service (Medical): No    Lack of Transportation (Non-Medical): No  Physical Activity: Sufficiently Active (11/13/2022)   Exercise Vital Sign    Days of Exercise per Week: 5 days    Minutes of Exercise per Session: 30 min  Stress: No Stress Concern Present (11/13/2022)   [employer] of Occupational Health - Occupational Stress Questionnaire    Feeling of Stress : Only a little  Social Connections: Moderately Isolated (11/13/2022)   Social Connection and Isolation Panel [NHANES]    Frequency of Communication with Friends and Family: Twice a week    Frequency of Social Gatherings with Friends and Family: Once a week    Attends Religious Services: Never    Database administrator or Organizations: No    Attends Banker Meetings: Not on file  Marital Status: Married    Family History  Problem Relation Age of Onset   Breast cancer Mother    Bone cancer Mother    Heart disease Father    Hypertension Father    Heart disease Brother    Diabetes Maternal Aunt    Lung cancer Maternal Grandmother    Lung cancer Maternal Grandfather    Hypertension Paternal Aunt        x 2   Heart disease Paternal Aunt        x 2   Diabetes Paternal Aunt    Hypertension Paternal Uncle        x 2   Heart disease Paternal Uncle        x 2    Health Maintenance  Topic Date Due   COVID-19 Vaccine (4 - 2023-24 season) 12/02/2022 (Originally 02/16/2022)   Zoster Vaccines- Shingrix (1 of 2) 02/16/2023  (Originally 12/21/2015)   Hepatitis C Screening  03/15/2023 (Originally 12/21/1983)   HIV Screening  03/15/2023 (Originally 12/20/1980)   DTaP/Tdap/Td (2 - Tdap) 12/03/2022   INFLUENZA VACCINE  01/17/2023   Colonoscopy  06/07/2030   HPV VACCINES  Aged Out     ----------------------------------------------------------------------------------------------------------------------------------------------------------------------------------------------------------------- Physical Exam BP 139/87   Pulse (!) 50   Ht 5\' 11"  (1.803 m)   Wt 240 lb (108.9 kg)   SpO2 99%   BMI 33.47 kg/m   Physical Exam Constitutional:      Appearance: Normal appearance.  HENT:     Head: Normocephalic and atraumatic.  Eyes:     General: No scleral icterus. Cardiovascular:     Rate and Rhythm: Normal rate.  Neurological:     Mental Status: He is alert.  Psychiatric:        Mood and Affect: Mood normal.        Behavior: Behavior normal.     ------------------------------------------------------------------------------------------------------------------------------------------------------------------------------------------------------------------- Assessment and Plan  HTN (hypertension) BP remains fairly well controlled. Continue lisinopril at current strength.   Palpitations Still with intermittent episodes. Nothing captured on zio patch previously.    Hand cramps Orders Placed This Encounter  Procedures   COMPLETE METABOLIC PANEL WITH GFR   Magnesium   B12   HgB A1c   Iron, TIBC and Ferritin Panel  Checking labs.  Doesn't really have any other symptoms suggestive of CTS.     Meds ordered this encounter  Medications   lisinopril (ZESTRIL) 10 MG tablet    Sig: Take 1 tablet (10 mg total) by mouth daily.    Dispense:  90 tablet    Refill:  3    Return in about 6 months (around 05/18/2023) for HTN.    This visit occurred during the SARS-CoV-2 public health emergency.  Safety protocols  were in place, including screening questions prior to the visit, additional usage of staff PPE, and extensive cleaning of exam room while observing appropriate contact time as indicated for disinfecting solutions.

## 2022-11-16 NOTE — Addendum Note (Signed)
Addended by: Mammie Lorenzo on: 11/16/2022 10:34 AM   Modules accepted: Orders

## 2022-11-17 LAB — HEMOGLOBIN A1C
Hgb A1c MFr Bld: 5.4 % of total Hgb (ref <5.7)
Mean Plasma Glucose: 108 mg/dL
eAG (mmol/L): 6 mmol/L

## 2022-11-17 LAB — COMPLETE METABOLIC PANEL WITH GFR
AG Ratio: 1.7 (calc) (ref 1.0–2.5)
ALT: 30 U/L (ref 9–46)
AST: 21 U/L (ref 10–35)
Albumin: 4.6 g/dL (ref 3.6–5.1)
Alkaline phosphatase (APISO): 64 U/L (ref 35–144)
BUN: 16 mg/dL (ref 7–25)
CO2: 26 mmol/L (ref 20–32)
Calcium: 9.6 mg/dL (ref 8.6–10.3)
Chloride: 104 mmol/L (ref 98–110)
Creat: 1.05 mg/dL (ref 0.70–1.30)
Globulin: 2.7 g/dL (calc) (ref 1.9–3.7)
Glucose, Bld: 87 mg/dL (ref 65–99)
Potassium: 4.5 mmol/L (ref 3.5–5.3)
Sodium: 138 mmol/L (ref 135–146)
Total Bilirubin: 0.7 mg/dL (ref 0.2–1.2)
Total Protein: 7.3 g/dL (ref 6.1–8.1)
eGFR: 83 mL/min/{1.73_m2} (ref >=60)

## 2022-11-17 LAB — MAGNESIUM: Magnesium: 2.5 mg/dL (ref 1.5–2.5)

## 2022-11-17 LAB — IRON,TIBC AND FERRITIN PANEL
%SAT: 33 % (calc) (ref 20–48)
Ferritin: 136 ng/mL (ref 38–380)
Iron: 94 ug/dL (ref 50–180)
TIBC: 288 mcg/dL (calc) (ref 250–425)

## 2022-11-17 LAB — VITAMIN B12: Vitamin B-12: >2000 pg/mL — ABNORMAL HIGH (ref 200–1100)

## 2022-11-26 ENCOUNTER — Encounter: Payer: Self-pay | Admitting: Family Medicine

## 2022-11-26 DIAGNOSIS — Z125 Encounter for screening for malignant neoplasm of prostate: Secondary | ICD-10-CM

## 2022-12-15 ENCOUNTER — Other Ambulatory Visit: Payer: Self-pay | Admitting: Family Medicine

## 2022-12-15 DIAGNOSIS — R972 Elevated prostate specific antigen [PSA]: Secondary | ICD-10-CM

## 2022-12-15 LAB — PSA: PSA: 4.27 ng/mL — ABNORMAL HIGH (ref <=4.00)

## 2022-12-16 ENCOUNTER — Other Ambulatory Visit: Payer: Self-pay | Admitting: Family Medicine

## 2022-12-16 DIAGNOSIS — N401 Enlarged prostate with lower urinary tract symptoms: Secondary | ICD-10-CM

## 2022-12-18 ENCOUNTER — Encounter: Payer: Self-pay | Admitting: Family Medicine

## 2023-01-01 ENCOUNTER — Encounter: Payer: Self-pay | Admitting: Family Medicine

## 2023-01-01 DIAGNOSIS — I77811 Abdominal aortic ectasia: Secondary | ICD-10-CM

## 2023-01-08 ENCOUNTER — Other Ambulatory Visit: Payer: Self-pay

## 2023-01-08 DIAGNOSIS — R972 Elevated prostate specific antigen [PSA]: Secondary | ICD-10-CM

## 2023-01-08 NOTE — Progress Notes (Signed)
Switched labs to [facility].  

## 2023-01-09 ENCOUNTER — Other Ambulatory Visit: Payer: Self-pay | Admitting: Family Medicine

## 2023-01-09 ENCOUNTER — Ambulatory Visit: Payer: Managed Care, Other (non HMO)

## 2023-01-09 ENCOUNTER — Encounter: Payer: Self-pay | Admitting: Family Medicine

## 2023-01-09 DIAGNOSIS — I77811 Abdominal aortic ectasia: Secondary | ICD-10-CM

## 2023-01-09 DIAGNOSIS — R972 Elevated prostate specific antigen [PSA]: Secondary | ICD-10-CM

## 2023-01-09 LAB — PSA: Prostate Specific Ag, Serum: 7.1 ng/mL — ABNORMAL HIGH (ref 0.0–4.0)

## 2023-01-14 NOTE — Telephone Encounter (Signed)
Can we try to get this read soon, please?

## 2023-01-14 NOTE — Telephone Encounter (Signed)
Called [facility] and they will see if they can get this pulled to the top to be read quickly.

## 2023-01-14 NOTE — Telephone Encounter (Signed)
Thanks!  CM

## 2023-01-18 ENCOUNTER — Encounter: Payer: Self-pay | Admitting: Urology

## 2023-01-18 ENCOUNTER — Ambulatory Visit: Payer: Managed Care, Other (non HMO) | Admitting: Urology

## 2023-01-18 VITALS — BP 126/75 | HR 72 | Ht 72.0 in | Wt 230.0 lb

## 2023-01-18 DIAGNOSIS — N401 Enlarged prostate with lower urinary tract symptoms: Secondary | ICD-10-CM | POA: Diagnosis not present

## 2023-01-18 DIAGNOSIS — R972 Elevated prostate specific antigen [PSA]: Secondary | ICD-10-CM | POA: Diagnosis not present

## 2023-01-18 DIAGNOSIS — N529 Male erectile dysfunction, unspecified: Secondary | ICD-10-CM | POA: Diagnosis not present

## 2023-01-18 NOTE — Progress Notes (Signed)
Assessment: 1. Elevated PSA   2. Benign localized prostatic hyperplasia with lower urinary tract symptoms (LUTS)   3. Erectile dysfunction, unspecified erectile dysfunction type      Plan: Patient will continue current medical therapy for BPH and erectile dysfunction with both tamsulosin and daily Cialis.  I had a long discussion with Cleburn today regarding PSA and the issues and controversies regarding prostate cancer early detection.  We discussed options for further evaluation including prostate imaging and biopsy.  Following our discussion, patient elects to proceed with transrectal ultrasound and prostate biopsy. Nature procedure reviewed in detail including potential adverse events and complications including bleeding and infection and need for periprocedural antibiotics.  Schedule TRUS biopsy next available  Chief Complaint: No chief complaint on file.   History of Present Illness:  Anthony Lawson is a 57 y.o. male with a past medical history of hypertension, BPH/lower urinary tract symptoms and ED who is seen in consultation from Everrett Coombe, DO for evaluation of elevated PSA. Patient has a long history of BPH/lower urinary tract symptoms.  Previously he was followed by Dr. Zachery Conch in [place] and was started on tamsulosin in 2018.  He states that this helped a lot with his symptoms but over the last several years his urinary tract symptoms seem to worsen and he also developed some erectile dysfunction. Patient has been taking daily Cialis since January 2024 and reports this is also not only helped his ED but his lower urinary tract symptoms.  Current IPSS = 5.  There is no family history of prostate cancer  DRE reveals 40 to 50 g gland without evidence of nodules or induration    PSA data: 08/2016  2.2 03/2018 2.6 04/2019 2.1 01/2020  1.5 01/2021  2.28 04/2022 2.69 11/2022  4.27 12/2022  7.1   Past Medical History:  Past Medical History:  Diagnosis Date    BPH (benign prostatic hyperplasia) 09/29/2015   ED (erectile dysfunction) 09/29/2015   HTN (hypertension) 09/29/2015   Hyperlipidemia 09/29/2015   Multiple dysplastic nevi    Prurigo nodularis 08/22/2017   Injected left lateral lower leg [facility]    Past Surgical History:  Past Surgical History:  Procedure Laterality Date   Dysplastic nevi removal left scapular back April 2019 Left 10/01/2017   [facility]   ROTATOR CUFF REPAIR Left 2012   x 2 - second one in 2022    Allergies:  No Known Allergies  Family History:  Family History  Problem Relation Age of Onset   Breast cancer Mother    Bone cancer Mother    Heart disease Father    Hypertension Father    Heart disease Brother    Diabetes Maternal Aunt    Lung cancer Maternal Grandmother    Lung cancer Maternal Grandfather    Hypertension Paternal Aunt        x 2   Heart disease Paternal Aunt        x 2   Diabetes Paternal Aunt    Hypertension Paternal Uncle        x 2   Heart disease Paternal Uncle        x 2    Social History:  Social History   Tobacco Use   Smoking status: Never   Smokeless tobacco: Never  Vaping Use   Vaping status: Never Used  Substance Use Topics   Alcohol use: Yes    Comment: 6-8 per week   Drug use: No  Review of symptoms:  Constitutional:  Negative for unexplained weight loss, night sweats, fever, chills ENT:  Negative for nose bleeds, sinus pain, painful swallowing CV:  Negative for chest pain, shortness of breath, exercise intolerance, palpitations, loss of consciousness Resp:  Negative for cough, wheezing, shortness of breath GI:  Negative for nausea, vomiting, diarrhea, bloody stools GU:  Positives noted in HPI; otherwise negative for gross hematuria, dysuria, urinary incontinence Neuro:  Negative for seizures, poor balance, limb weakness, slurred speech Psych:  Negative for lack of energy, depression, anxiety Endocrine:  Negative for  polydipsia, polyuria, symptoms of hypoglycemia (dizziness, hunger, sweating) Hematologic:  Negative for anemia, purpura, petechia, prolonged or excessive bleeding, use of anticoagulants  Allergic:  Negative for difficulty breathing or choking as a result of exposure to anything; no shellfish allergy; no allergic response (rash/itch) to materials, foods  Physical exam: BP 126/75   Pulse 72   Ht 6' (1.829 m)   Wt 230 lb (104.3 kg)   BMI 31.19 kg/m  GENERAL APPEARANCE:  Well appearing, well developed, well nourished, NAD  GU: Normal external genitalia DRE: Normal sphincter tone; prostate is approximately 40 to 50 g without evidence of nodules or induration  Results: Urinalysis is negative

## 2023-01-21 ENCOUNTER — Encounter: Payer: Self-pay | Admitting: Family Medicine

## 2023-01-21 LAB — MICROSCOPIC EXAMINATION

## 2023-02-05 ENCOUNTER — Telehealth: Payer: Self-pay | Admitting: Urology

## 2023-02-05 NOTE — Telephone Encounter (Signed)
Sch for a biopsy on 08/27 and wants to know if it is ok for him to go to the beach or if he should avoid the water.

## 2023-02-11 ENCOUNTER — Telehealth: Payer: Self-pay | Admitting: Urology

## 2023-02-11 NOTE — Telephone Encounter (Signed)
Pt was unsure about the antibiotic prior to the biopsy tomorrow. Advised pt that there would be an injection of medication prior to his procedure. Pt verbalized understanding.

## 2023-02-12 ENCOUNTER — Other Ambulatory Visit (HOSPITAL_BASED_OUTPATIENT_CLINIC_OR_DEPARTMENT_OTHER): Payer: Self-pay | Admitting: Urology

## 2023-02-12 ENCOUNTER — Encounter: Payer: Self-pay | Admitting: Urology

## 2023-02-12 ENCOUNTER — Ambulatory Visit (HOSPITAL_BASED_OUTPATIENT_CLINIC_OR_DEPARTMENT_OTHER)
Admission: RE | Admit: 2023-02-12 | Discharge: 2023-02-12 | Disposition: A | Discharge status: Home / Self Care | Payer: Managed Care, Other (non HMO) | Source: Ambulatory Visit | Attending: Urology | Admitting: Urology

## 2023-02-12 ENCOUNTER — Ambulatory Visit (INDEPENDENT_AMBULATORY_CARE_PROVIDER_SITE_OTHER): Payer: Managed Care, Other (non HMO) | Admitting: Urology

## 2023-02-12 ENCOUNTER — Encounter: Payer: Self-pay | Admitting: Family Medicine

## 2023-02-12 VITALS — BP 150/84 | HR 74 | Ht 72.0 in | Wt 230.0 lb

## 2023-02-12 DIAGNOSIS — R972 Elevated prostate specific antigen [PSA]: Secondary | ICD-10-CM | POA: Diagnosis present

## 2023-02-12 DIAGNOSIS — N400 Enlarged prostate without lower urinary tract symptoms: Secondary | ICD-10-CM | POA: Insufficient documentation

## 2023-02-12 DIAGNOSIS — Z2989 Encounter for other specified prophylactic measures: Secondary | ICD-10-CM | POA: Diagnosis not present

## 2023-02-12 MED ORDER — CEFTRIAXONE SODIUM 1 G IJ SOLR
1.0000 g | Freq: Once | INTRAMUSCULAR | Status: AC
  Administered 2023-02-12: 1 g via INTRAMUSCULAR

## 2023-02-12 NOTE — Progress Notes (Signed)
   Assessment: 1. Elevated PSA     Plan: FU 1 week to review path and options  Chief Complaint: HERE FOR BIOPSY  HPI: Anthony Lawson is a 57 y.o. male who presents for continued evaluation of BPH and elevated psa. Here today for planned trus/bx.   Portions of the above documentation were copied from a prior visit for review purposes only.  Allergies: No Known Allergies  PMH: Past Medical History:  Diagnosis Date   BPH (benign prostatic hyperplasia) 09/29/2015   ED (erectile dysfunction) 09/29/2015   HTN (hypertension) 09/29/2015   Hyperlipidemia 09/29/2015   Multiple dysplastic nevi    Prurigo nodularis 08/22/2017   Injected left lateral lower leg [facility]    PSH: Past Surgical History:  Procedure Laterality Date   Dysplastic nevi removal left scapular back April 2019 Left 10/01/2017   [facility]   ROTATOR CUFF REPAIR Left 2012   x 2 - second one in 2022    SH: Social History   Tobacco Use   Smoking status: Never   Smokeless tobacco: Never  Vaping Use   Vaping status: Never Used  Substance Use Topics   Alcohol use: Yes    Comment: 6-8 per week   Drug use: No    ROS: Constitutional:  Negative for fever, chills, weight loss CV: Negative for chest pain, previous MI, hypertension Respiratory:  Negative for shortness of breath, wheezing, sleep apnea, frequent cough GI:  Negative for nausea, vomiting, bloody stool, GERD  PE: BP (!) 150/84   Pulse 74   Ht 6' (1.829 m)   Wt 230 lb (104.3 kg)   BMI 31.19 kg/m  GENERAL APPEARANCE:  Well appearing, well developed, well nourished, NAD    Results: UA- clear   TRANSRECTAL ULTRASOUND AND PROSTATE BIOPSY  Indication:  Elevated PSA  Prophylactic antibiotic administration: Rocephin  All medications that could result in increased bleeding were discontinued within an appropriate period of the time of biopsy.  Risk including bleeding and infection were discussed.   Informed consent was obtained.  The patient was placed in the left lateral decubitus position.  DRE:  nst, prostate 30gm without n/i  PROCEDURE 1.  TRANSRECTAL ULTRASOUND OF THE PROSTATE  The 7 MHz transrectal probe was used to image the prostate.  Anal stenosis was not noted.  TRUS volume: 30 ml  Hypoechoic areas: None  Hyperechoic areas: None  Central calcifications: present- EXTENSIVE BILATERALLY  Margins:  normal  Seminal Vesicles: normal   PROCEDURE 2:  PROSTATE BIOPSY  A periprostatic block was performed using 1% lidocaine and transrectal ultrasound guidance. Under transrectal ultrasound guidance, and using the Biopty gun, prostate biopsies were obtained systematically from the apex, mid gland, and base bilaterally.  A total of 12 cores were obtained.  Hemostasis was obtained with gentle pressure on the prostate.  The procedures were well-tolerated.  No significant bleeding was noted at the end of the procedure.  The patient was stable for discharge from the office.

## 2023-02-12 NOTE — Telephone Encounter (Signed)
I am thinking this might have been meant for Dr. Ashley Royalty

## 2023-02-12 NOTE — Progress Notes (Signed)
 IM Injection  Patient is present today for an IM Injection for treatment of infection prevention post prostate biopsy Drug: Ceftriaxone Dose:1g Location:right glute Lot: [id number] Exp:08/2024 Patient tolerated well, no complications were noted  Performed by: Vianca Bracher N., CMA(AAMA)

## 2023-02-19 ENCOUNTER — Encounter: Payer: Self-pay | Admitting: Urology

## 2023-02-20 ENCOUNTER — Encounter: Payer: Self-pay | Admitting: Urology

## 2023-02-20 ENCOUNTER — Ambulatory Visit: Payer: Managed Care, Other (non HMO) | Admitting: Urology

## 2023-02-20 VITALS — BP 143/83 | HR 60

## 2023-02-20 DIAGNOSIS — N401 Enlarged prostate with lower urinary tract symptoms: Secondary | ICD-10-CM

## 2023-02-20 DIAGNOSIS — R972 Elevated prostate specific antigen [PSA]: Secondary | ICD-10-CM

## 2023-02-20 DIAGNOSIS — N529 Male erectile dysfunction, unspecified: Secondary | ICD-10-CM

## 2023-02-20 LAB — URINALYSIS, ROUTINE W REFLEX MICROSCOPIC
Bilirubin, UA: NEGATIVE
Glucose, UA: NEGATIVE
Ketones, UA: NEGATIVE
Leukocytes,UA: NEGATIVE
Nitrite, UA: NEGATIVE
Protein,UA: NEGATIVE
Specific Gravity, UA: 1.01 (ref 1.005–1.030)
Urobilinogen, Ur: 0.2 mg/dL (ref 0.2–1.0)
pH, UA: 6.5 (ref 5.0–7.5)

## 2023-02-20 NOTE — Progress Notes (Signed)
Assessment: 1. Elevated PSA   2. Benign localized prostatic hyperplasia with lower urinary tract symptoms (LUTS)   3. Erectile dysfunction, unspecified erectile dysfunction type     Plan: Today I had a long discussion with the patient regarding his urologic issues. Patient will continue daily Cialis for his ED Continue tamsulosin 0.4 mg daily for lower urinary tract symptoms  Also had a long discussion today with the patient and his wife regarding his prostate pathology which showed no evidence of malignancy. I discussed with him the need for ongoing PSA follow-up. Patient will return in 5 months with PSA prior to visit.  Chief Complaint: Biopsy results  HPI: Anthony Lawson is a 57 y.o. male who presents for continued evaluation of a number of urologic issues. These include elevated PSA status post negative biopsy 01/2023, BPH/lower urinary tract symptoms on tamsulosin as well as ED on daily Cialis. Patient reports no complications as a result of his recent biopsy. Please see my note 01/18/2023 at the time of initial visit for detailed history and exam.   Patient has a long history of BPH/lower urinary tract symptoms.  Previously he was followed by Dr. Zachery Conch in [place] and was started on tamsulosin in 2018.  He states that this helped a lot with his symptoms but over the last several years his urinary tract symptoms seem to worsen and he also developed some erectile dysfunction. Patient has been taking daily Cialis since January 2024 and reports this is also not only helped his ED but his lower urinary tract symptoms.  Current IPSS = 5.   There is no family history of prostate cancer   DRE reveals 40 to 50 g gland without evidence of nodules or induration       PSA /PROSTATE data: 08/2016             2.2 10/20192.6 11/20202.1 01/2020             1.5 01/2021             2.28 11/20232.69 11/2022             4.27 12/2022             7.1 01/2023  transrectal  ultrasound/BX-prostate volume = 30; pathology benign  Portions of the above documentation were copied from a prior visit for review purposes only.  Allergies: No Known Allergies  PMH: Past Medical History:  Diagnosis Date   BPH (benign prostatic hyperplasia) 09/29/2015   ED (erectile dysfunction) 09/29/2015   HTN (hypertension) 09/29/2015   Hyperlipidemia 09/29/2015   Multiple dysplastic nevi    Prurigo nodularis 08/22/2017   Injected left lateral lower leg [facility]    PSH: Past Surgical History:  Procedure Laterality Date   Dysplastic nevi removal left scapular back April 2019 Left 10/01/2017   [facility]   ROTATOR CUFF REPAIR Left 2012   x 2 - second one in 2022    SH: Social History   Tobacco Use   Smoking status: Never   Smokeless tobacco: Never  Vaping Use   Vaping status: Never Used  Substance Use Topics   Alcohol use: Yes    Comment: 6-8 per week   Drug use: No    ROS: Constitutional:  Negative for fever, chills, weight loss CV: Negative for chest pain, previous MI, hypertension Respiratory:  Negative for shortness of breath, wheezing, sleep apnea, frequent cough GI:  Negative for nausea, vomiting, bloody stool, GERD  PE: BP Marland Kitchen)  143/83   Pulse 60  GENERAL APPEARANCE:  Well appearing, well developed, well nourished, NAD    Results: UA shows 3-10 RBC/hpf otherwise negative

## 2023-02-21 IMAGING — DX DG KNEE AP/LAT W/ SUNRISE*L*
3 series · 3 of 3 positions shown · non-contrast
Comparison: None.

CLINICAL DATA: Chronic and worsening left knee pain.

EXAM:
LEFT KNEE 3 VIEWS

[knee ap]
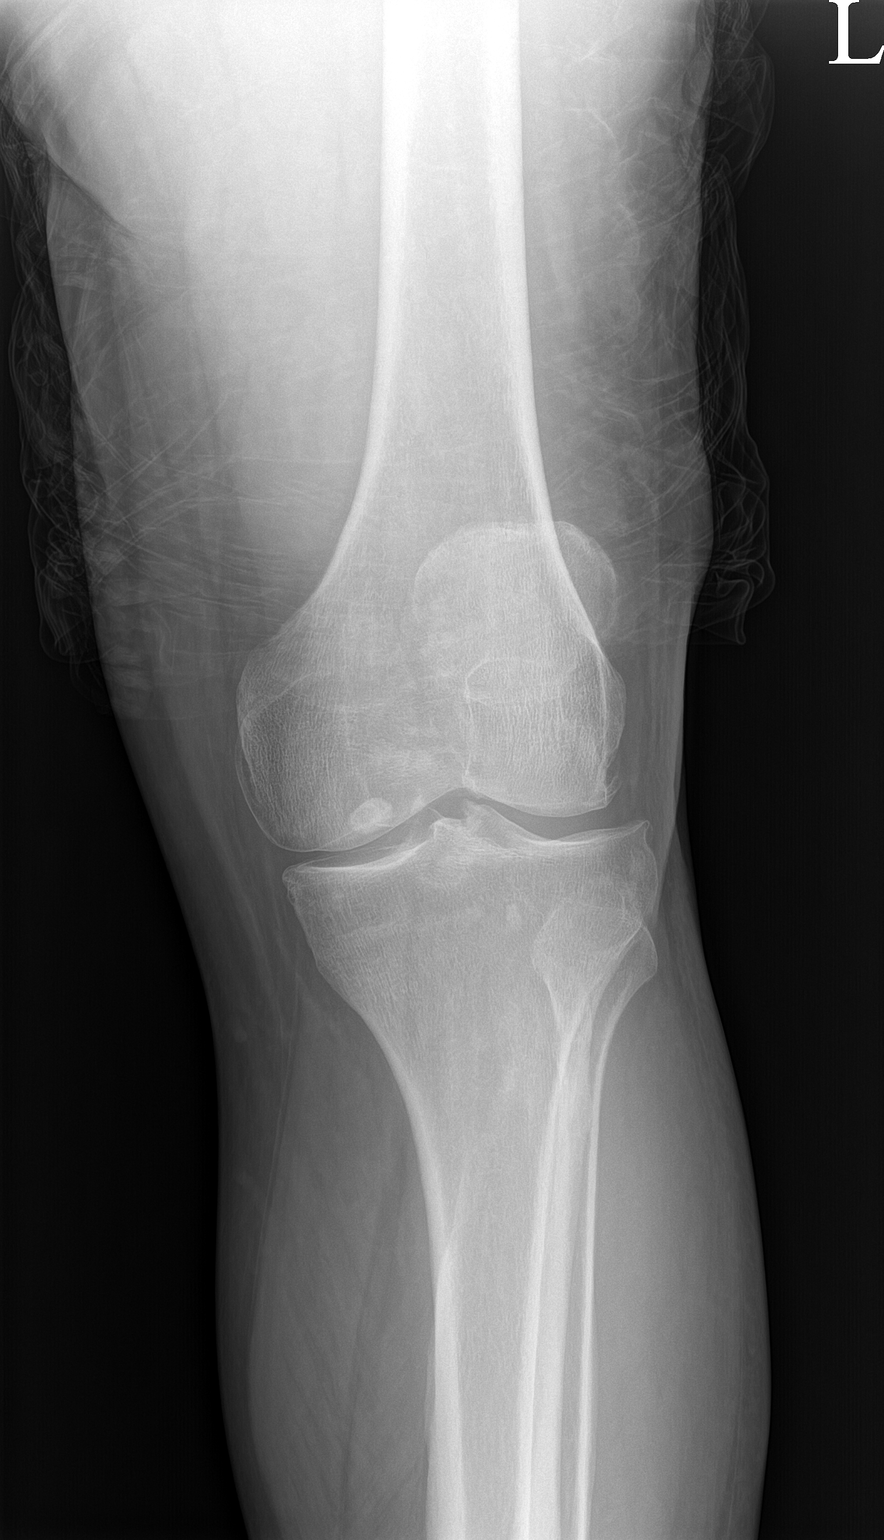

[knee lat]
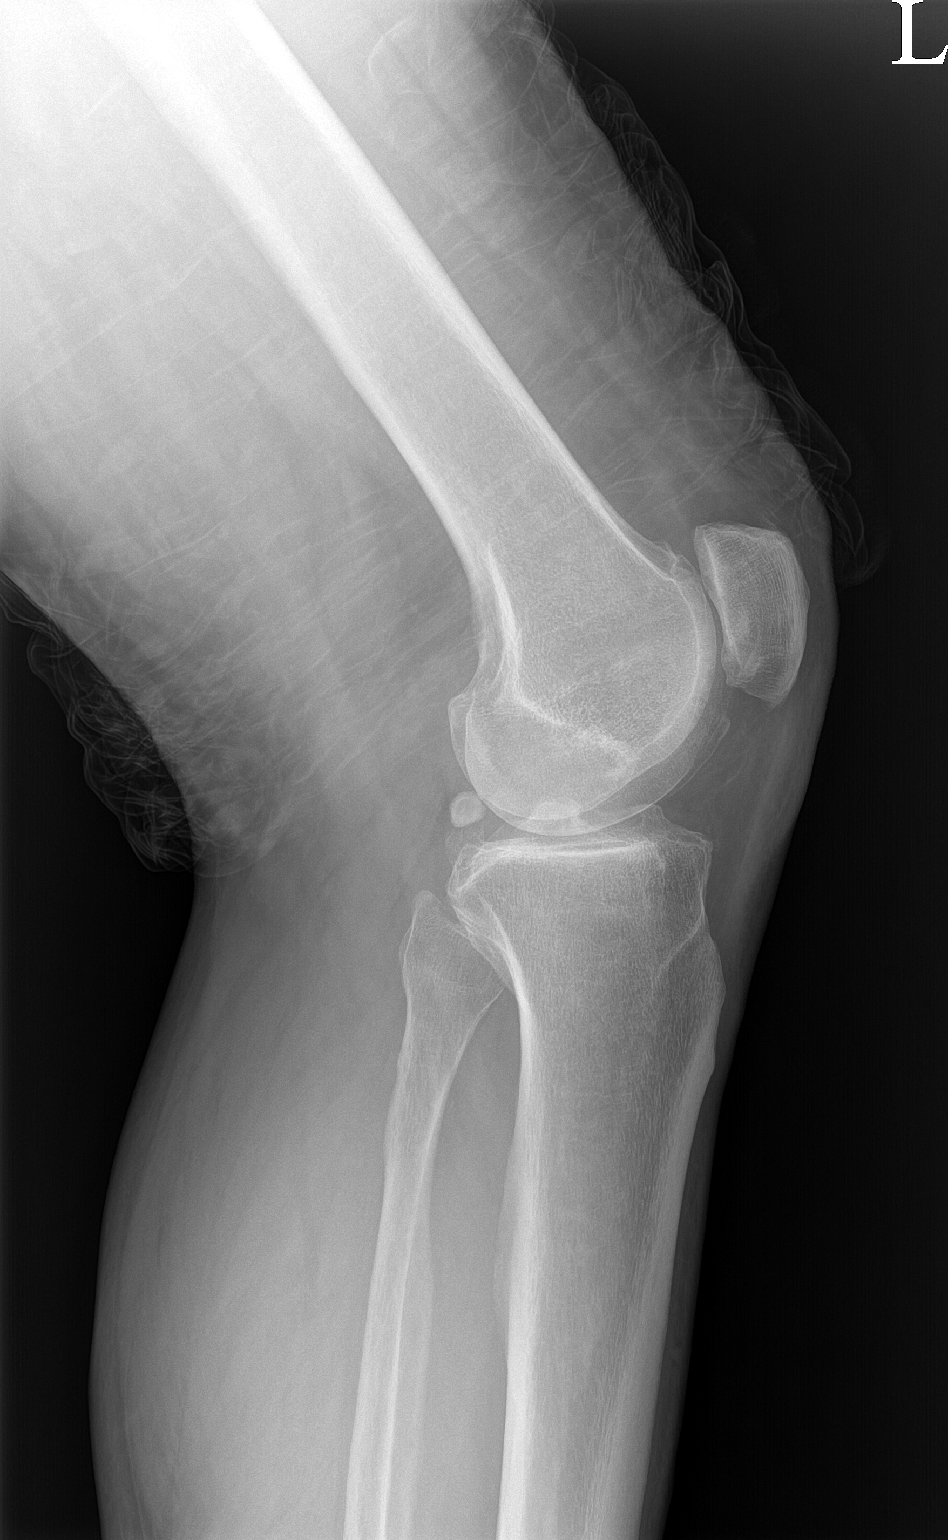

[patella]
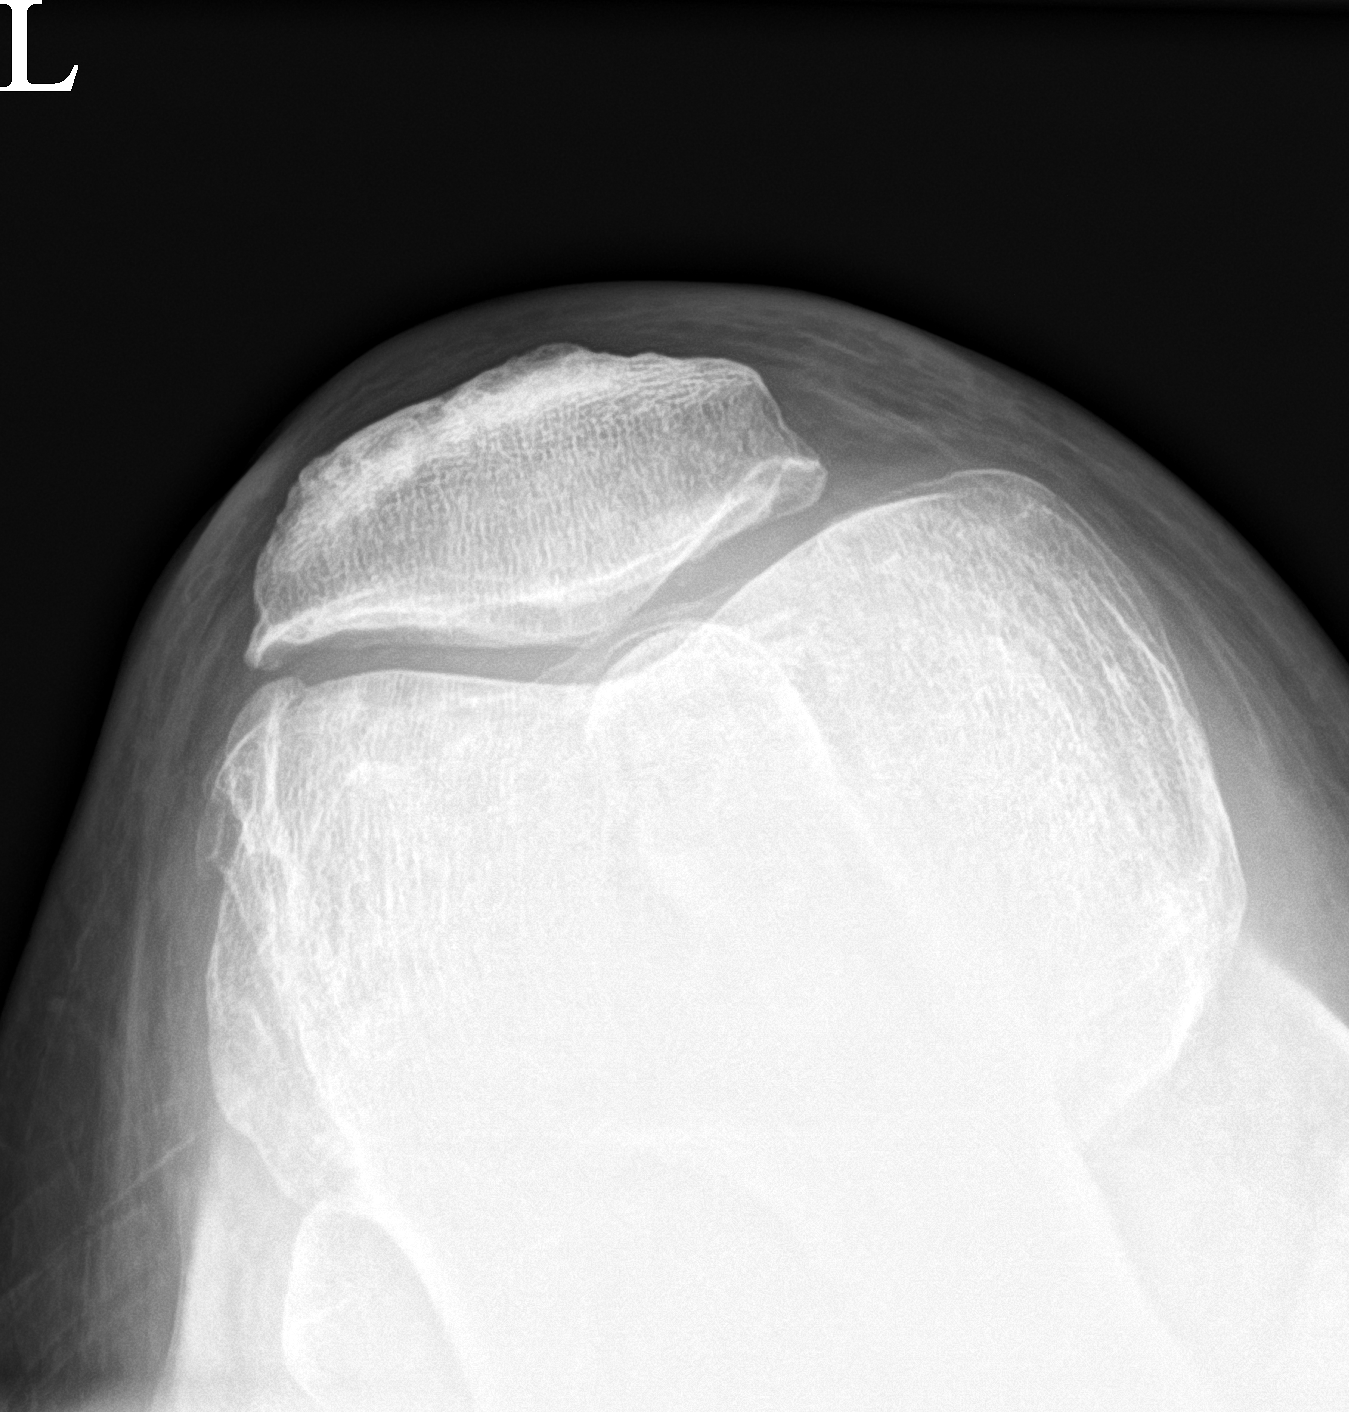

[3 of 3 positions shown; findings below may reference images not displayed]

FINDINGS: Mild medial tibiofemoral joint space narrowing. Mild to moderate
tricompartmental peripheral spurring as well as spurring of the
tibial spines. 12 mm well corticated ovoid density projects over the
medial femoral condyle, possible intra-articular body, not
definitively localized on the lateral view trace knee joint
effusion. No fracture, erosion, or bony destruction. No soft tissue
abnormalities.
IMPRESSION: 1. Mild to moderate tricompartmental osteoarthritis with trace knee
joint effusion.
2. Probable 12 mm all soft intra-articular body projecting over the
medial femoral condyle.

## 2023-02-25 LAB — URINALYSIS, ROUTINE W REFLEX MICROSCOPIC
Bilirubin, UA: NEGATIVE
Glucose, UA: NEGATIVE
Ketones, UA: NEGATIVE
Leukocytes,UA: NEGATIVE
Nitrite, UA: NEGATIVE
Protein,UA: NEGATIVE
Specific Gravity, UA: 1.015 (ref 1.005–1.030)
Urobilinogen, Ur: 0.2 mg/dL (ref 0.2–1.0)
pH, UA: 7 (ref 5.0–7.5)

## 2023-02-25 LAB — MICROSCOPIC EXAMINATION

## 2023-03-13 ENCOUNTER — Other Ambulatory Visit: Payer: Self-pay | Admitting: Family Medicine

## 2023-03-13 DIAGNOSIS — N401 Enlarged prostate with lower urinary tract symptoms: Secondary | ICD-10-CM

## 2023-05-20 ENCOUNTER — Encounter: Payer: Self-pay | Admitting: Family Medicine

## 2023-05-20 ENCOUNTER — Ambulatory Visit: Payer: Managed Care, Other (non HMO) | Admitting: Family Medicine

## 2023-05-20 VITALS — BP 112/74 | HR 70 | Ht 72.0 in | Wt 228.0 lb

## 2023-05-20 DIAGNOSIS — Z23 Encounter for immunization: Secondary | ICD-10-CM | POA: Diagnosis not present

## 2023-05-20 DIAGNOSIS — R3912 Poor urinary stream: Secondary | ICD-10-CM | POA: Diagnosis not present

## 2023-05-20 DIAGNOSIS — E782 Mixed hyperlipidemia: Secondary | ICD-10-CM

## 2023-05-20 DIAGNOSIS — I10 Essential (primary) hypertension: Secondary | ICD-10-CM

## 2023-05-20 DIAGNOSIS — N401 Enlarged prostate with lower urinary tract symptoms: Secondary | ICD-10-CM

## 2023-05-20 NOTE — Assessment & Plan Note (Signed)
Tolerating simvastatin well.  Update lipid panel.  

## 2023-05-20 NOTE — Assessment & Plan Note (Signed)
Elevated PSA being followed by urology.  Recent bx negative.

## 2023-05-20 NOTE — Progress Notes (Signed)
Anthony Lawson - 57 y.o. male MRN [id number]  Date of birth: May 28, 1966  Subjective Chief Complaint  Patient presents with   Hypertension   Immunizations    HPI Anthony Lawson is a 57 y.o. male here today for follow up visit.   Overall he is doing well  Remains on lisinopril 10mg  for management of HTN.  BP has remained well controlled with this.  He has not had symptoms related to HTN including chest pain, shortness of breath, palpitations, headache or vision changes.   He is also tolerating simvastatin well. No side effects related to this.   He had prostate bx in August with benign pathology.  Continuing to trend PSA levels.    ROS:  A comprehensive ROS was completed and negative except as noted per HPI  No Known Allergies  Past Medical History:  Diagnosis Date   BPH (benign prostatic hyperplasia) 09/29/2015   ED (erectile dysfunction) 09/29/2015   HTN (hypertension) 09/29/2015   Hyperlipidemia 09/29/2015   Multiple dysplastic nevi    Prurigo nodularis 08/22/2017   Injected left lateral lower leg [facility]    Past Surgical History:  Procedure Laterality Date   Dysplastic nevi removal left scapular back April 2019 Left 10/01/2017   [facility]   ROTATOR CUFF REPAIR Left 2012   x 2 - second one in 2022    Social History   Socioeconomic History   Marital status: Married    Spouse name: Not on file   Number of children: 0   Years of education: Not on file   Highest education level: Bachelor's degree (e.g., BA, AB, BS)  Occupational History   Occupation: Sales  Tobacco Use   Smoking status: Never   Smokeless tobacco: Never  Vaping Use   Vaping status: Never Used  Substance and Sexual Activity   Alcohol use: Yes    Comment: 6-8 per week   Drug use: No   Sexual activity: Yes  Other Topics Concern   Not on file  Social History Narrative   1 adopted daughter   Social Determinants of Health   Financial Resource  Strain: Low Risk  (05/19/2023)   Overall Financial Resource Strain (CARDIA)    Difficulty of Paying Living Expenses: Not hard at all  Food Insecurity: No Food Insecurity (05/19/2023)   Hunger Vital Sign    Worried About Running Out of Food in the Last Year: Never true    Ran Out of Food in the Last Year: Never true  Transportation Needs: No Transportation Needs (05/19/2023)   PRAPARE - Administrator, Civil Service (Medical): No    Lack of Transportation (Non-Medical): No  Physical Activity: Insufficiently Active (05/19/2023)   Exercise Vital Sign    Days of Exercise per Week: 5 days    Minutes of Exercise per Session: 20 min  Stress: Stress Concern Present (05/19/2023)   [employer] of Occupational Health - Occupational Stress Questionnaire    Feeling of Stress : To some extent  Social Connections: Unknown (05/19/2023)   Social Connection and Isolation Panel [NHANES]    Frequency of Communication with Friends and Family: Three times a week    Frequency of Social Gatherings with Friends and Family: Once a week    Attends Religious Services: Patient declined    Database administrator or Organizations: No    Attends Engineer, structural: Not on file    Marital Status: Married    Family History  Problem  Relation Age of Onset   Breast cancer Mother    Bone cancer Mother    Heart disease Father    Hypertension Father    Heart disease Brother    Diabetes Maternal Aunt    Lung cancer Maternal Grandmother    Lung cancer Maternal Grandfather    Hypertension Paternal Aunt        x 2   Heart disease Paternal Aunt        x 2   Diabetes Paternal Aunt    Hypertension Paternal Uncle        x 2   Heart disease Paternal Uncle        x 2    Health Maintenance  Topic Date Due   HIV Screening  Never done   Hepatitis C Screening  Never done   Zoster Vaccines- Shingrix (1 of 2) Never done   COVID-19 Vaccine (4 - 2023-24 season) 08/17/2023 (Originally 02/17/2023)    Colonoscopy  06/07/2030   DTaP/Tdap/Td (3 - Td or Tdap) 05/19/2033   INFLUENZA VACCINE  Completed   HPV VACCINES  Aged Out     ----------------------------------------------------------------------------------------------------------------------------------------------------------------------------------------------------------------- Physical Exam BP 112/74   Pulse 70   Ht 6' (1.829 m)   Wt 228 lb (103.4 kg)   SpO2 98%   BMI 30.92 kg/m   Physical Exam Constitutional:      Appearance: Normal appearance.  HENT:     Head: Normocephalic and atraumatic.  Cardiovascular:     Rate and Rhythm: Normal rate and regular rhythm.  Pulmonary:     Effort: Pulmonary effort is normal.     Breath sounds: Normal breath sounds.  Neurological:     General: No focal deficit present.     Mental Status: He is alert.  Psychiatric:        Mood and Affect: Mood normal.        Behavior: Behavior normal.     ------------------------------------------------------------------------------------------------------------------------------------------------------------------------------------------------------------------- Assessment and Plan  BPH (benign prostatic hyperplasia) Elevated PSA being followed by urology.  Recent bx negative.   Hyperlipidemia Tolerating simvastatin well.  Update lipid panel.   HTN (hypertension) BP remains fairly well controlled. Continue lisinopril at current strength.    No orders of the defined types were placed in this encounter.   Return in about 6 months (around 11/18/2023) for Annual Exam, Hypertension.    This visit occurred during the SARS-CoV-2 public health emergency.  Safety protocols were in place, including screening questions prior to the visit, additional usage of staff PPE, and extensive cleaning of exam room while observing appropriate contact time as indicated for disinfecting solutions.

## 2023-05-20 NOTE — Assessment & Plan Note (Signed)
BP remains fairly well controlled. Continue lisinopril at current strength.

## 2023-05-21 LAB — CMP14+EGFR
ALT: 31 [IU]/L (ref 0–44)
AST: 24 [IU]/L (ref 0–40)
Albumin: 4.5 g/dL (ref 3.8–4.9)
Alkaline Phosphatase: 79 [IU]/L (ref 44–121)
BUN/Creatinine Ratio: 14 (ref 9–20)
BUN: 13 mg/dL (ref 6–24)
Bilirubin Total: 0.4 mg/dL (ref 0.0–1.2)
CO2: 21 mmol/L (ref 20–29)
Calcium: 9.7 mg/dL (ref 8.7–10.2)
Chloride: 103 mmol/L (ref 96–106)
Creatinine, Ser: 0.92 mg/dL (ref 0.76–1.27)
Globulin, Total: 2.6 g/dL (ref 1.5–4.5)
Glucose: 98 mg/dL (ref 70–99)
Potassium: 4.3 mmol/L (ref 3.5–5.2)
Sodium: 140 mmol/L (ref 134–144)
Total Protein: 7.1 g/dL (ref 6.0–8.5)
eGFR: 97 mL/min/{1.73_m2} (ref >59)

## 2023-05-21 LAB — LIPID PANEL WITH LDL/HDL RATIO
Cholesterol, Total: 174 mg/dL (ref 100–199)
HDL: 47 mg/dL (ref >39)
LDL Chol Calc (NIH): 103 mg/dL — ABNORMAL HIGH (ref 0–99)
LDL/HDL Ratio: 2.2 {ratio} (ref 0.0–3.6)
Triglycerides: 138 mg/dL (ref 0–149)
VLDL Cholesterol Cal: 24 mg/dL (ref 5–40)

## 2023-05-21 LAB — PSA: Prostate Specific Ag, Serum: 5.7 ng/mL — ABNORMAL HIGH (ref 0.0–4.0)

## 2023-06-10 ENCOUNTER — Other Ambulatory Visit: Payer: Self-pay | Admitting: Family Medicine

## 2023-06-10 DIAGNOSIS — N401 Enlarged prostate with lower urinary tract symptoms: Secondary | ICD-10-CM

## 2023-06-18 ENCOUNTER — Other Ambulatory Visit: Payer: Self-pay | Admitting: Family Medicine

## 2023-06-18 DIAGNOSIS — N401 Enlarged prostate with lower urinary tract symptoms: Secondary | ICD-10-CM

## 2023-06-25 ENCOUNTER — Encounter: Payer: Self-pay | Admitting: Urology

## 2023-06-25 ENCOUNTER — Ambulatory Visit: Payer: Managed Care, Other (non HMO) | Admitting: Urology

## 2023-06-25 VITALS — BP 131/79 | HR 83 | Ht 72.0 in | Wt 230.0 lb

## 2023-06-25 DIAGNOSIS — N529 Male erectile dysfunction, unspecified: Secondary | ICD-10-CM

## 2023-06-25 DIAGNOSIS — N401 Enlarged prostate with lower urinary tract symptoms: Secondary | ICD-10-CM

## 2023-06-25 DIAGNOSIS — R972 Elevated prostate specific antigen [PSA]: Secondary | ICD-10-CM | POA: Diagnosis not present

## 2023-06-25 NOTE — Progress Notes (Signed)
 Assessment: 1. Elevated PSA   2. Benign localized prostatic hyperplasia with lower urinary tract symptoms (LUTS)   3. Erectile dysfunction, unspecified erectile dysfunction type     Plan: Continue current meds with daily Cialis  and tamsulosin  for BPH and ED.  Follow-up 6 months for recheck with PSA prior to visit.  If PSA stable at that time could go to yearly follow-up.  Chief Complaint: Elevated PSA/ED  HPI: Anthony Lawson is a 58 y.o. male who presents for continued evaluation of a number of urologic issues. Please see my note 01/18/2019 for time of initial visit for detailed history and exam.  Patient has a history of elevated PSA-negative biopsy 01/2023 BPH/lower urinary tract symptoms on tamsulosin  Erectile dysfunction on daily Cialis     Patient has a long history of BPH/lower urinary tract symptoms.  Previously he was followed by Dr. Saturnino in [place] and was started on tamsulosin  in 2018.  He states that this helped a lot with his symptoms but over the last several years his urinary tract symptoms seem to worsen and he also developed some erectile dysfunction. Patient has been taking daily Cialis  since January 2024 and reports this is also not only helped his ED but his lower urinary tract symptoms.    Baseline IPSS = 5. IPSS today= 10  There is no family history of prostate cancer   DRE reveals 40 to 50 g gland without evidence of nodules or induration       PSA /PROSTATE data: 08/2016             2.2 10/20192.6 11/20202.1 01/2020             1.5 01/2021             2.28 11/20232.69 11/2022             4.27 12/2022             7.1 01/2023             transrectal ultrasound/BX-prostate volume = 30; pathology benign 05/2023 5.7  Portions of the above documentation were copied from a prior visit for review purposes only.  Allergies: No Known Allergies  PMH: Past Medical History:  Diagnosis Date   BPH (benign prostatic hyperplasia) 09/29/2015   ED (erectile  dysfunction) 09/29/2015   HTN (hypertension) 09/29/2015   Hyperlipidemia 09/29/2015   Multiple dysplastic nevi    Prurigo nodularis 08/22/2017   Injected left lateral lower leg [facility]    PSH: Past Surgical History:  Procedure Laterality Date   Dysplastic nevi removal left scapular back April 2019 Left 10/01/2017   [facility]   ROTATOR CUFF REPAIR Left 2012   x 2 - second one in 2022    SH: Social History   Tobacco Use   Smoking status: Never   Smokeless tobacco: Never  Vaping Use   Vaping status: Never Used  Substance Use Topics   Alcohol use: Yes    Comment: 6-8 per week   Drug use: No    ROS: Constitutional:  Negative for fever, chills, weight loss CV: Negative for chest pain, previous MI, hypertension Respiratory:  Negative for shortness of breath, wheezing, sleep apnea, frequent cough GI:  Negative for nausea, vomiting, bloody stool, GERD  PE: BP 131/79   Pulse 83   Ht 6' (1.829 m)   Wt 230 lb (104.3 kg)   BMI 31.19 kg/m  GENERAL APPEARANCE:  Well appearing, well developed, well nourished, NAD

## 2023-06-25 NOTE — Addendum Note (Signed)
 Addended by: Carolin Coy on: 06/25/2023 10:15 AM   Modules accepted: Orders

## 2023-07-01 ENCOUNTER — Other Ambulatory Visit: Payer: Self-pay | Admitting: Family Medicine

## 2023-07-01 ENCOUNTER — Encounter: Payer: Self-pay | Admitting: Family Medicine

## 2023-08-16 ENCOUNTER — Other Ambulatory Visit: Payer: Self-pay | Admitting: Family Medicine

## 2023-08-16 DIAGNOSIS — Z0184 Encounter for antibody response examination: Secondary | ICD-10-CM

## 2023-08-23 ENCOUNTER — Encounter: Payer: Self-pay | Admitting: Family Medicine

## 2023-08-23 LAB — MEASLES/MUMPS/RUBELLA IMMUNITY
MUMPS ABS, IGG: <9 [AU]/ml — ABNORMAL LOW (ref >10.9)
RUBEOLA AB, IGG: 270 [AU]/ml (ref >16.4)
Rubella Antibodies, IGG: >33 {index} (ref >0.99)

## 2023-08-29 ENCOUNTER — Other Ambulatory Visit: Payer: Self-pay | Admitting: Family Medicine

## 2023-08-29 DIAGNOSIS — E782 Mixed hyperlipidemia: Secondary | ICD-10-CM

## 2023-09-09 ENCOUNTER — Other Ambulatory Visit: Payer: Self-pay | Admitting: Internal Medicine

## 2023-09-19 ENCOUNTER — Encounter: Payer: Self-pay | Admitting: Family Medicine

## 2023-09-24 ENCOUNTER — Encounter: Payer: Self-pay | Admitting: Family Medicine

## 2023-09-24 DIAGNOSIS — R972 Elevated prostate specific antigen [PSA]: Secondary | ICD-10-CM

## 2023-09-24 DIAGNOSIS — E782 Mixed hyperlipidemia: Secondary | ICD-10-CM

## 2023-09-24 DIAGNOSIS — I10 Essential (primary) hypertension: Secondary | ICD-10-CM

## 2023-09-24 NOTE — Telephone Encounter (Signed)
 Lab order pended. PSA ordered by an external provider. Please advise, thanks.

## 2023-09-30 ENCOUNTER — Other Ambulatory Visit (HOSPITAL_COMMUNITY): Payer: Self-pay

## 2023-09-30 ENCOUNTER — Other Ambulatory Visit: Payer: Self-pay | Admitting: Family Medicine

## 2023-09-30 ENCOUNTER — Telehealth: Payer: Self-pay

## 2023-09-30 NOTE — Telephone Encounter (Signed)
 Pharmacy Patient Advocate Encounter   Received notification from Patient Pharmacy that prior authorization for Tadalafil 5 is required/requested.   Insurance verification completed.   The patient is insured through [employer] .   Per test claim: PA required; PA submitted to above mentioned insurance via CoverMyMeds Key/confirmation #/EOC [id number] Status is pending

## 2023-09-30 NOTE — Telephone Encounter (Signed)
 Pharmacy Patient Advocate Encounter  Received notification from [employer] that Prior Authorization for Tadalafil 5 has been APPROVED from 08/31/23 to 09/29/24. Ran test claim, Copay is $30.00 for 90 tablets. This test claim was processed through [facility]- copay amounts may vary at other pharmacies due to pharmacy/plan contracts, or as the patient moves through the different stages of their insurance plan.   PA #/Case ID/Reference #: [id number]

## 2023-10-08 LAB — CMP14+EGFR
ALT: 22 IU/L (ref 0–44)
AST: 21 IU/L (ref 0–40)
Albumin: 4.6 g/dL (ref 3.8–4.9)
Alkaline Phosphatase: 76 IU/L (ref 44–121)
BUN/Creatinine Ratio: 24 — ABNORMAL HIGH (ref 9–20)
BUN: 23 mg/dL (ref 6–24)
Bilirubin Total: 0.4 mg/dL (ref 0.0–1.2)
CO2: 19 mmol/L — ABNORMAL LOW (ref 20–29)
Calcium: 9.6 mg/dL (ref 8.7–10.2)
Chloride: 104 mmol/L (ref 96–106)
Creatinine, Ser: 0.97 mg/dL (ref 0.76–1.27)
Globulin, Total: 2.3 g/dL (ref 1.5–4.5)
Glucose: 85 mg/dL (ref 70–99)
Potassium: 4.3 mmol/L (ref 3.5–5.2)
Sodium: 137 mmol/L (ref 134–144)
Total Protein: 6.9 g/dL (ref 6.0–8.5)
eGFR: 91 mL/min/{1.73_m2} (ref >59)

## 2023-10-08 LAB — CBC WITH DIFFERENTIAL/PLATELET
Basophils Absolute: 0.1 10*3/uL (ref 0.0–0.2)
Basos: 1 %
EOS (ABSOLUTE): 0.1 10*3/uL (ref 0.0–0.4)
Eos: 2 %
Hematocrit: 46 % (ref 37.5–51.0)
Hemoglobin: 15.8 g/dL (ref 13.0–17.7)
Immature Grans (Abs): 0 10*3/uL (ref 0.0–0.1)
Immature Granulocytes: 1 %
Lymphocytes Absolute: 1.3 10*3/uL (ref 0.7–3.1)
Lymphs: 22 %
MCH: 31.1 pg (ref 26.6–33.0)
MCHC: 34.3 g/dL (ref 31.5–35.7)
MCV: 91 fL (ref 79–97)
Monocytes Absolute: 0.5 10*3/uL (ref 0.1–0.9)
Monocytes: 8 %
Neutrophils Absolute: 3.7 10*3/uL (ref 1.4–7.0)
Neutrophils: 66 %
Platelets: 258 10*3/uL (ref 150–450)
RBC: 5.08 x10E6/uL (ref 4.14–5.80)
RDW: 12.5 % (ref 11.6–15.4)
WBC: 5.7 10*3/uL (ref 3.4–10.8)

## 2023-10-08 LAB — PSA, TOTAL AND FREE
PSA, Free Pct: 14.6 %
PSA, Free: 0.6 ng/mL
Prostate Specific Ag, Serum: 4.1 ng/mL — ABNORMAL HIGH (ref 0.0–4.0)

## 2023-10-08 LAB — HEMOGLOBIN A1C
Est. average glucose Bld gHb Est-mCnc: 97 mg/dL
Hgb A1c MFr Bld: 5 % (ref 4.8–5.6)

## 2023-10-08 LAB — LIPID PANEL
Chol/HDL Ratio: 3.4 ratio (ref 0.0–5.0)
Cholesterol, Total: 131 mg/dL (ref 100–199)
HDL: 39 mg/dL — ABNORMAL LOW (ref >39)
LDL Chol Calc (NIH): 70 mg/dL (ref 0–99)
Triglycerides: 125 mg/dL (ref 0–149)
VLDL Cholesterol Cal: 22 mg/dL (ref 5–40)

## 2023-10-14 ENCOUNTER — Encounter: Payer: Self-pay | Admitting: Family Medicine

## 2023-11-19 ENCOUNTER — Encounter: Payer: Managed Care, Other (non HMO) | Admitting: Family Medicine

## 2023-12-03 ENCOUNTER — Encounter: Payer: Self-pay | Admitting: Family Medicine

## 2023-12-03 ENCOUNTER — Ambulatory Visit (INDEPENDENT_AMBULATORY_CARE_PROVIDER_SITE_OTHER): Payer: Managed Care, Other (non HMO) | Admitting: Family Medicine

## 2023-12-03 VITALS — BP 119/70 | HR 66 | Ht 72.0 in | Wt 216.0 lb

## 2023-12-03 DIAGNOSIS — R350 Frequency of micturition: Secondary | ICD-10-CM

## 2023-12-03 DIAGNOSIS — I10 Essential (primary) hypertension: Secondary | ICD-10-CM | POA: Diagnosis not present

## 2023-12-03 DIAGNOSIS — N401 Enlarged prostate with lower urinary tract symptoms: Secondary | ICD-10-CM

## 2023-12-03 DIAGNOSIS — R3912 Poor urinary stream: Secondary | ICD-10-CM

## 2023-12-03 DIAGNOSIS — E785 Hyperlipidemia, unspecified: Secondary | ICD-10-CM

## 2023-12-03 DIAGNOSIS — Z0000 Encounter for general adult medical examination without abnormal findings: Secondary | ICD-10-CM

## 2023-12-03 MED ORDER — TAMSULOSIN HCL 0.4 MG PO CAPS: 0.8000 mg | ORAL_CAPSULE | Freq: Every day | ORAL | 1 refills | Status: DC

## 2023-12-03 NOTE — Progress Notes (Signed)
 Anthony Lawson - 59 y.o. male MRN [id number]  Date of birth: 1966-01-27  Subjective Chief Complaint  Patient presents with   Annual Exam   Hypertension    HPI Anthony Lawson is a 58 y.o. male here today for for annual exam.   He reports that he is doing pretty well.   He is doing well with current medications.    He is moderately active and feels that his diet is pretty good.   He is a non-smoker. Occasional EtOH.   Review of Systems  Constitutional:  Negative for chills, fever, malaise/fatigue and weight loss.  HENT:  Negative for congestion, ear pain and sore throat.   Eyes:  Negative for blurred vision, double vision and pain.  Respiratory:  Negative for cough and shortness of breath.   Cardiovascular:  Negative for chest pain and palpitations.  Gastrointestinal:  Negative for abdominal pain, blood in stool, constipation, heartburn and nausea.  Genitourinary:  Negative for dysuria and urgency.  Musculoskeletal:  Negative for joint pain and myalgias.  Neurological:  Negative for dizziness and headaches.  Endo/Heme/Allergies:  Does not bruise/bleed easily.  Psychiatric/Behavioral:  Negative for depression. The patient is not nervous/anxious and does not have insomnia.       No Known Allergies  Past Medical History:  Diagnosis Date   BPH (benign prostatic hyperplasia) 09/29/2015   ED (erectile dysfunction) 09/29/2015   HTN (hypertension) 09/29/2015   Hyperlipidemia 09/29/2015   Multiple dysplastic nevi    Prurigo nodularis 08/22/2017   Injected left lateral lower leg [facility]    Past Surgical History:  Procedure Laterality Date   Dysplastic nevi removal left scapular back April 2019 Left 10/01/2017   [facility]   ROTATOR CUFF REPAIR Left 2012   x 2 - second one in 2022    Social History   Socioeconomic History   Marital status: Married    Spouse name: Not on file   Number of children: 0   Years of education: Not  on file   Highest education level: Bachelor's degree (e.g., BA, AB, BS)  Occupational History   Occupation: Sales  Tobacco Use   Smoking status: Never   Smokeless tobacco: Never  Vaping Use   Vaping status: Never Used  Substance and Sexual Activity   Alcohol use: Yes    Comment: 6-8 per week   Drug use: No   Sexual activity: Yes  Other Topics Concern   Not on file  Social History Narrative   1 adopted daughter   Social Drivers of Corporate investment banker Strain: Low Risk  (12/02/2023)   Overall Financial Resource Strain (CARDIA)    Difficulty of Paying Living Expenses: Not hard at all  Food Insecurity: No Food Insecurity (12/02/2023)   Hunger Vital Sign    Worried About Running Out of Food in the Last Year: Never true    Ran Out of Food in the Last Year: Never true  Transportation Needs: No Transportation Needs (12/02/2023)   PRAPARE - Administrator, Civil Service (Medical): No    Lack of Transportation (Non-Medical): No  Physical Activity: Sufficiently Active (12/02/2023)   Exercise Vital Sign    Days of Exercise per Week: 5 days    Minutes of Exercise per Session: 30 min  Stress: No Stress Concern Present (12/02/2023)   [employer] of Occupational Health - Occupational Stress Questionnaire    Feeling of Stress: Only a little  Social Connections: Socially Isolated (12/02/2023)  Social Advertising account executive    Frequency of Communication with Friends and Family: Once a week    Frequency of Social Gatherings with Friends and Family: Once a week    Attends Religious Services: Never    Database administrator or Organizations: No    Attends Engineer, structural: Not on file    Marital Status: Married    Family History  Problem Relation Age of Onset   Breast cancer Mother    Bone cancer Mother    Heart disease Father    Hypertension Father    Heart disease Brother    Diabetes Maternal Aunt    Lung cancer Maternal Grandmother     Lung cancer Maternal Grandfather    Hypertension Paternal Aunt        x 2   Heart disease Paternal Aunt        x 2   Diabetes Paternal Aunt    Hypertension Paternal Uncle        x 2   Heart disease Paternal Uncle        x 2    Health Maintenance  Topic Date Due   HIV Screening  Never done   Hepatitis C Screening  Never done   Zoster Vaccines- Shingrix (1 of 2) Never done   COVID-19 Vaccine (4 - 2024-25 season) 02/17/2023   INFLUENZA VACCINE  01/17/2024   Colonoscopy  06/07/2030   DTaP/Tdap/Td (3 - Td or Tdap) 05/19/2033   HPV VACCINES  Aged Out   Meningococcal B Vaccine  Aged Out     ----------------------------------------------------------------------------------------------------------------------------------------------------------------------------------------------------------------- Physical Exam BP 119/70 (BP Location: Left Arm, Patient Position: Sitting, Cuff Size: Normal)   Pulse 66   Ht 6' (1.829 m)   Wt 216 lb (98 kg)   SpO2 98%   BMI 29.29 kg/m   Physical Exam Constitutional:      General: He is not in acute distress. HENT:     Head: Normocephalic and atraumatic.     Right Ear: Tympanic membrane and external ear normal.     Left Ear: Tympanic membrane and external ear normal.   Eyes:     General: No scleral icterus.  Neck:     Thyroid : No thyromegaly.   Cardiovascular:     Rate and Rhythm: Normal rate and regular rhythm.     Heart sounds: Normal heart sounds.  Pulmonary:     Effort: Pulmonary effort is normal.     Breath sounds: Normal breath sounds.  Abdominal:     General: Bowel sounds are normal. There is no distension.     Palpations: Abdomen is soft.     Tenderness: There is no abdominal tenderness. There is no guarding.   Musculoskeletal:     Cervical back: Normal range of motion.  Lymphadenopathy:     Cervical: No cervical adenopathy.   Skin:    General: Skin is warm and dry.     Findings: No rash.   Neurological:     Mental  Status: He is alert and oriented to person, place, and time.     Cranial Nerves: No cranial nerve deficit.     Motor: No abnormal muscle tone.   Psychiatric:        Mood and Affect: Mood normal.        Behavior: Behavior normal.     ------------------------------------------------------------------------------------------------------------------------------------------------------------------------------------------------------------------- Assessment and Plan  Well adult exam Well adult Orders Placed This Encounter  Procedures   CMP14+EGFR   CBC with Differential/Platelet  Lipid Panel With LDL/HDL Ratio   PSA  Screenings: Per lab orders Immunizations: Flu vaccine given today. Anticipatory guidance/risk factor reduction: Recommendations per AVS.  BPH (benign prostatic hyperplasia) Having increased voiding difficulty.  Increase flomax  to 0.8mg  daily.    Meds ordered this encounter  Medications   tamsulosin  (FLOMAX ) 0.4 MG CAPS capsule    Sig: Take 2 capsules (0.8 mg total) by mouth daily.    Dispense:  180 capsule    Refill:  1    No follow-ups on file.

## 2023-12-03 NOTE — Assessment & Plan Note (Signed)
 Having increased voiding difficulty.  Increase flomax  to 0.8mg  daily.

## 2023-12-03 NOTE — Assessment & Plan Note (Signed)
 Well adult Orders Placed This Encounter  Procedures   CMP14+EGFR   CBC with Differential/Platelet   Lipid Panel With LDL/HDL Ratio   PSA  Screenings: Per lab orders Immunizations: Flu vaccine given today. Anticipatory guidance/risk factor reduction: Recommendations per AVS.

## 2023-12-03 NOTE — Patient Instructions (Signed)

## 2023-12-04 LAB — LIPID PANEL WITH LDL/HDL RATIO
Cholesterol, Total: 131 mg/dL (ref 100–199)
HDL: 45 mg/dL (ref >39)
LDL Chol Calc (NIH): 71 mg/dL (ref 0–99)
LDL/HDL Ratio: 1.6 ratio (ref 0.0–3.6)
Triglycerides: 76 mg/dL (ref 0–149)
VLDL Cholesterol Cal: 15 mg/dL (ref 5–40)

## 2023-12-04 LAB — CMP14+EGFR
ALT: 30 IU/L (ref 0–44)
AST: 18 IU/L (ref 0–40)
Albumin: 4.4 g/dL (ref 3.8–4.9)
Alkaline Phosphatase: 77 IU/L (ref 44–121)
BUN/Creatinine Ratio: 15 (ref 9–20)
BUN: 13 mg/dL (ref 6–24)
Bilirubin Total: 0.5 mg/dL (ref 0.0–1.2)
CO2: 20 mmol/L (ref 20–29)
Calcium: 9.5 mg/dL (ref 8.7–10.2)
Chloride: 105 mmol/L (ref 96–106)
Creatinine, Ser: 0.85 mg/dL (ref 0.76–1.27)
Globulin, Total: 2.4 g/dL (ref 1.5–4.5)
Glucose: 98 mg/dL (ref 70–99)
Potassium: 4.3 mmol/L (ref 3.5–5.2)
Sodium: 141 mmol/L (ref 134–144)
Total Protein: 6.8 g/dL (ref 6.0–8.5)
eGFR: 101 mL/min/{1.73_m2} (ref >59)

## 2023-12-04 LAB — CBC WITH DIFFERENTIAL/PLATELET
Basophils Absolute: 0.1 10*3/uL (ref 0.0–0.2)
Basos: 2 %
EOS (ABSOLUTE): 0.1 10*3/uL (ref 0.0–0.4)
Eos: 3 %
Hematocrit: 46.4 % (ref 37.5–51.0)
Hemoglobin: 15.5 g/dL (ref 13.0–17.7)
Immature Grans (Abs): 0 10*3/uL (ref 0.0–0.1)
Immature Granulocytes: 0 %
Lymphocytes Absolute: 1.2 10*3/uL (ref 0.7–3.1)
Lymphs: 25 %
MCH: 30.6 pg (ref 26.6–33.0)
MCHC: 33.4 g/dL (ref 31.5–35.7)
MCV: 92 fL (ref 79–97)
Monocytes Absolute: 0.4 10*3/uL (ref 0.1–0.9)
Monocytes: 7 %
Neutrophils Absolute: 3 10*3/uL (ref 1.4–7.0)
Neutrophils: 63 %
Platelets: 221 10*3/uL (ref 150–450)
RBC: 5.06 x10E6/uL (ref 4.14–5.80)
RDW: 12.6 % (ref 11.6–15.4)
WBC: 4.8 10*3/uL (ref 3.4–10.8)

## 2023-12-04 LAB — PSA: Prostate Specific Ag, Serum: 4.1 ng/mL — ABNORMAL HIGH (ref 0.0–4.0)

## 2023-12-06 ENCOUNTER — Ambulatory Visit: Payer: Self-pay | Admitting: Family Medicine

## 2023-12-06 ENCOUNTER — Other Ambulatory Visit: Payer: Self-pay | Admitting: Internal Medicine

## 2023-12-14 ENCOUNTER — Other Ambulatory Visit: Payer: Self-pay | Admitting: Family Medicine

## 2023-12-14 DIAGNOSIS — N401 Enlarged prostate with lower urinary tract symptoms: Secondary | ICD-10-CM

## 2023-12-23 ENCOUNTER — Other Ambulatory Visit: Payer: Self-pay | Admitting: Family Medicine

## 2023-12-23 DIAGNOSIS — I10 Essential (primary) hypertension: Secondary | ICD-10-CM

## 2023-12-30 ENCOUNTER — Ambulatory Visit: Payer: Managed Care, Other (non HMO) | Admitting: Urology

## 2023-12-30 ENCOUNTER — Encounter: Payer: Self-pay | Admitting: Urology

## 2023-12-30 VITALS — BP 132/79 | HR 68 | Ht 72.0 in | Wt 210.0 lb

## 2023-12-30 DIAGNOSIS — N529 Male erectile dysfunction, unspecified: Secondary | ICD-10-CM | POA: Diagnosis not present

## 2023-12-30 DIAGNOSIS — R972 Elevated prostate specific antigen [PSA]: Secondary | ICD-10-CM

## 2023-12-30 DIAGNOSIS — N401 Enlarged prostate with lower urinary tract symptoms: Secondary | ICD-10-CM | POA: Diagnosis not present

## 2023-12-30 LAB — MICROSCOPIC EXAMINATION: Bacteria, UA: NONE SEEN

## 2023-12-30 LAB — URINALYSIS, ROUTINE W REFLEX MICROSCOPIC
Bilirubin, UA: NEGATIVE
Glucose, UA: NEGATIVE
Ketones, UA: NEGATIVE
Leukocytes,UA: NEGATIVE
Nitrite, UA: NEGATIVE
Protein,UA: NEGATIVE
Specific Gravity, UA: 1.025 (ref 1.005–1.030)
Urobilinogen, Ur: 0.2 mg/dL (ref 0.2–1.0)
pH, UA: 5.5 (ref 5.0–7.5)

## 2023-12-30 LAB — BLADDER SCAN AMB NON-IMAGING

## 2023-12-30 MED ORDER — ALFUZOSIN HCL ER 10 MG PO TB24: 10.0000 mg | ORAL_TABLET | Freq: Every day | ORAL | 11 refills | Status: AC

## 2023-12-30 NOTE — Progress Notes (Signed)
 Assessment: 1. Elevated PSA   2. Benign localized prostatic hyperplasia with lower urinary tract symptoms (LUTS)   3. Erectile dysfunction, unspecified erectile dysfunction type     Plan: I personally reviewed the patient's chart including provider notes, and lab results. Will continue to monitor his PSA as it is currently stable. Continue tadalafil  5 mg daily. Trial of alfuzosin  10 mg daily in place of tamsulosin . Return to office in 6 months with PSA.  Chief Complaint: Chief Complaint  Patient presents with   Elevated PSA    HPI: Anthony Lawson is a 58 y.o. male who presents for continued evaluation of elevated PSA, BPH with lower urinary tract symptoms, and erectile dysfunction. He was previously followed by Dr. Shona and was last seen in January 2025.  Elevated PSA: PSA /PROSTATE data: 08/2016             2.2 10/20192.6 11/20202.1 01/2020             1.5 01/2021             2.28 11/20232.69 11/2022             4.27 12/2022             7.1 01/2023             transrectal ultrasound/BX-prostate volume = 30 g; pathology benign 05/2023 5.7 4/25  4.1 14.6% free 6/25  4.1  There is no family history of prostate cancer. DRE reveals 40 to 50 g gland without evidence of nodules or induration.  BPH/lower urinary tract symptoms: Patient has a long history of BPH/lower urinary tract symptoms.  Previously he was followed by Dr. Saturnino in [place] and was started on tamsulosin  in 2018.  He states that this helped a lot with his symptoms but over the last several years his urinary tract symptoms seem to worsen and he also developed some erectile dysfunction. Patient has been taking daily Cialis  since January 2024 and reports this is also not only helped his ED but his lower urinary tract symptoms.     Baseline IPSS = 5. IPSS 1/25 = 10   He has noted some gradual worsening of his urinary symptoms.  He is actually increased his tamsulosin  to 0.8 mg daily.  He has not seen a  significant improvement with this increased dose.  He continues with a weak stream and frequency.  No dysuria or gross hematuria. IPSS = 12/3.  Erectile dysfunction: He has been managed with daily Cialis . He continues with daily tadalafil  with adequate management of his ED.  Portions of the above documentation were copied from a prior visit for review purposes only.  Allergies: No Known Allergies  PMH: Past Medical History:  Diagnosis Date   BPH (benign prostatic hyperplasia) 09/29/2015   ED (erectile dysfunction) 09/29/2015   HTN (hypertension) 09/29/2015   Hyperlipidemia 09/29/2015   Multiple dysplastic nevi    Prurigo nodularis 08/22/2017   Injected left lateral lower leg [facility]    PSH: Past Surgical History:  Procedure Laterality Date   Dysplastic nevi removal left scapular back April 2019 Left 10/01/2017   [facility]   ROTATOR CUFF REPAIR Left 2012   x 2 - second one in 2022    SH: Social History   Tobacco Use   Smoking status: Never   Smokeless tobacco: Never  Vaping Use   Vaping status: Never Used  Substance Use Topics   Alcohol use: Yes    Comment: 6-8  per week   Drug use: No    ROS: Constitutional:  Negative for fever, chills, weight loss CV: Negative for chest pain, previous MI, hypertension Respiratory:  Negative for shortness of breath, wheezing, sleep apnea, frequent cough GI:  Negative for nausea, vomiting, bloody stool, GERD  PE: BP 132/79   Pulse 68   Ht 6' (1.829 m)   Wt 210 lb (95.3 kg)   BMI 28.48 kg/m  GENERAL APPEARANCE:  Well appearing, well developed, well nourished, NAD HEENT:  Atraumatic, normocephalic, oropharynx clear NECK:  Supple without lymphadenopathy or thyromegaly ABDOMEN:  Soft, non-tender, no masses EXTREMITIES:  Moves all extremities well, without clubbing, cyanosis, or edema NEUROLOGIC:  Alert and oriented x 3, normal gait, CN II-XII grossly intact MENTAL STATUS:   appropriate BACK:  Non-tender to palpation, No CVAT SKIN:  Warm, dry, and intact   Results: U/A:  0-5 WBC, 0-2 RBC  PVR = 35 ml

## 2024-02-03 ENCOUNTER — Encounter: Payer: Self-pay | Admitting: Family Medicine

## 2024-02-03 MED ORDER — PANTOPRAZOLE SODIUM 40 MG PO TBEC: 40.0000 mg | DELAYED_RELEASE_TABLET | Freq: Every day | ORAL | 1 refills | Status: DC

## 2024-02-25 ENCOUNTER — Ambulatory Visit: Admitting: Family Medicine

## 2024-02-27 NOTE — Progress Notes (Unsigned)
   LILLETTE Ileana Collet, PhD, LAT, ATC acting as a scribe for Artist Lloyd, MD.  Anthony Lawson is a 58 y.o. male who presents to [facility] today for back pain. Pt was previously seen by Dr. Lloyd on 08/04/21 for L knee pain.  Today, pt c/o back pain x ***. Pt locates pain to ***  Radiating pain: LE numbness/tingling: LE weakness: Aggravates: Treatments tried:  Pertinent review of systems: ***  Relevant historical information: ***   Exam:  There were no vitals taken for this visit. General: Well Developed, well nourished, and in no acute distress.   MSK: ***    Lab and Radiology Results No results found for this or any previous visit (from the past 72 hours). No results found.     Assessment and Plan: 58 y.o. male with ***   PDMP not reviewed this encounter. No orders of the defined types were placed in this encounter.  No orders of the defined types were placed in this encounter.    Discussed warning signs or symptoms. Please see discharge instructions. Patient expresses understanding.   ***

## 2024-02-28 ENCOUNTER — Ambulatory Visit: Admitting: Family Medicine

## 2024-02-28 ENCOUNTER — Encounter: Payer: Self-pay | Admitting: Family Medicine

## 2024-02-28 VITALS — BP 120/82 | HR 75 | Ht 72.0 in | Wt 214.0 lb

## 2024-02-28 DIAGNOSIS — G8929 Other chronic pain: Secondary | ICD-10-CM | POA: Diagnosis not present

## 2024-02-28 DIAGNOSIS — M545 Low back pain, unspecified: Secondary | ICD-10-CM

## 2024-02-28 MED ORDER — CYCLOBENZAPRINE HCL 10 MG PO TABS: 10.0000 mg | ORAL_TABLET | Freq: Three times a day (TID) | ORAL | 2 refills | Status: AC | PRN

## 2024-02-28 NOTE — Patient Instructions (Signed)
 Thank you for coming in today.

## 2024-03-02 MED ORDER — IBUPROFEN 800 MG PO TABS: 800.0000 mg | ORAL_TABLET | Freq: Three times a day (TID) | ORAL | 2 refills | Status: AC | PRN

## 2024-03-17 ENCOUNTER — Other Ambulatory Visit: Payer: Self-pay | Admitting: Family Medicine

## 2024-03-17 DIAGNOSIS — I10 Essential (primary) hypertension: Secondary | ICD-10-CM

## 2024-03-30 ENCOUNTER — Other Ambulatory Visit: Payer: Self-pay | Admitting: Family Medicine

## 2024-04-16 ENCOUNTER — Encounter: Payer: Self-pay | Admitting: Family Medicine

## 2024-04-16 DIAGNOSIS — N401 Enlarged prostate with lower urinary tract symptoms: Secondary | ICD-10-CM

## 2024-04-23 LAB — PSA: Prostate Specific Ag, Serum: 4.9 ng/mL — ABNORMAL HIGH (ref 0.0–4.0)

## 2024-04-24 ENCOUNTER — Ambulatory Visit: Payer: Self-pay | Admitting: Family Medicine

## 2024-06-23 DIAGNOSIS — I10 Essential (primary) hypertension: Secondary | ICD-10-CM

## 2024-07-01 ENCOUNTER — Ambulatory Visit: Admitting: Urology

## 2024-07-01 ENCOUNTER — Encounter: Payer: Self-pay | Admitting: Urology

## 2024-07-01 VITALS — BP 119/76 | HR 66 | Ht 72.0 in | Wt 214.0 lb

## 2024-07-01 DIAGNOSIS — N529 Male erectile dysfunction, unspecified: Secondary | ICD-10-CM

## 2024-07-01 DIAGNOSIS — R972 Elevated prostate specific antigen [PSA]: Secondary | ICD-10-CM | POA: Diagnosis not present

## 2024-07-01 DIAGNOSIS — N401 Enlarged prostate with lower urinary tract symptoms: Secondary | ICD-10-CM | POA: Diagnosis not present

## 2024-07-01 LAB — URINALYSIS, ROUTINE W REFLEX MICROSCOPIC
Bilirubin, UA: NEGATIVE
Glucose, UA: NEGATIVE
Ketones, UA: NEGATIVE
Leukocytes,UA: NEGATIVE
Nitrite, UA: NEGATIVE
Protein,UA: NEGATIVE
RBC, UA: NEGATIVE
Specific Gravity, UA: 1.02 (ref 1.005–1.030)
Urobilinogen, Ur: 1 mg/dL (ref 0.2–1.0)
pH, UA: 7 (ref 5.0–7.5)

## 2024-07-01 MED ORDER — TADALAFIL 5 MG PO TABS: 5.0000 mg | ORAL_TABLET | Freq: Every day | ORAL | 3 refills | Status: AC

## 2024-07-01 NOTE — Progress Notes (Signed)
 "  Assessment: 1. Benign localized prostatic hyperplasia with lower urinary tract symptoms (LUTS)   2. Elevated PSA   3. Erectile dysfunction, unspecified erectile dysfunction type     Plan: Continue tadalafil  5 mg daily. Continue alfuzosin  10 mg daily  Given persistent PSA elevation and prior negative biopsy, recommend further evaluation with prostate MRI. I will contact him with results and recommendations for follow-up. He is not interested in further evaluation of his BPH with urinary tract symptoms at this time and would like to continue medical therapy.  Chief Complaint: Chief Complaint  Patient presents with   Benign Prostatic Hypertrophy    HPI: Anthony Lawson is a 59 y.o. male who presents for continued evaluation of elevated PSA, BPH with lower urinary tract symptoms, and erectile dysfunction. He was previously followed by Dr. Shona and was last seen in January 2025.  Elevated PSA: PSA /PROSTATE data: 08/2016             2.2 10/20192.6 11/20202.1 01/2020             1.5 01/2021             2.28 11/20232.69 11/2022             4.27 12/2022             7.1 01/2023             transrectal ultrasound/BX-prostate volume = 30 g; pathology benign 05/2023 5.7 4/25  4.1 14.6% free 6/25  4.1 11/25  4.9  There is no family history of prostate cancer. DRE revealed 40 to 50 g gland without evidence of nodules or induration.  BPH/lower urinary tract symptoms: Patient has a long history of BPH/lower urinary tract symptoms.  Previously he was followed by Dr. Saturnino in [place] and was started on tamsulosin  in 2018.  He states that this helped a lot with his symptoms but over the last several years his urinary tract symptoms seem to worsen and he also developed some erectile dysfunction. Patient has been taking daily Cialis  since January 2024 and reports this is also not only helped his ED but his lower urinary tract symptoms.     Baseline IPSS = 5. IPSS 1/25 = 10   At his  visit in July 2025, he had noted some gradual worsening of his urinary symptoms.  He had increased his tamsulosin  to 0.8 mg daily without a significant improvement with this increased dose.  He continued with a weak stream and frequency.  No dysuria or gross hematuria. IPSS = 12/3. PVR = 35 mL He was changed to alfuzosin  10 mg daily.  He reports that his urinary symptoms are stable.  He continues to have a weak stream and frequency.  He continues on alfuzosin  10 mg daily.  No dysuria or gross hematuria. IPSS = 10/3.  Erectile dysfunction: He has been managed with daily Cialis . He continues with daily tadalafil  with adequate management of his ED.  Portions of the above documentation were copied from a prior visit for review purposes only.  Allergies: No Known Allergies  PMH: Past Medical History:  Diagnosis Date   BPH (benign prostatic hyperplasia) 09/29/2015   ED (erectile dysfunction) 09/29/2015   HTN (hypertension) 09/29/2015   Hyperlipidemia 09/29/2015   Multiple dysplastic nevi    Prurigo nodularis 08/22/2017   Injected left lateral lower leg [facility]    PSH: Past Surgical History:  Procedure Laterality Date   Dysplastic nevi removal left scapular back April  2019 Left 10/01/2017   [facility]   ROTATOR CUFF REPAIR Left 2012   x 2 - second one in 2022    SH: Social History   Tobacco Use   Smoking status: Never   Smokeless tobacco: Never  Vaping Use   Vaping status: Never Used  Substance Use Topics   Alcohol use: Yes    Comment: 6-8 per week   Drug use: No    ROS: Constitutional:  Negative for fever, chills, weight loss CV: Negative for chest pain, previous MI, hypertension Respiratory:  Negative for shortness of breath, wheezing, sleep apnea, frequent cough GI:  Negative for nausea, vomiting, bloody stool, GERD  PE: BP 119/76   Pulse 66   Ht 6' (1.829 m)   Wt 214 lb (97.1 kg)   BMI 29.02 kg/m  GENERAL APPEARANCE:   Well appearing, well developed, well nourished, NAD HEENT:  Atraumatic, normocephalic, oropharynx clear NECK:  Supple without lymphadenopathy or thyromegaly ABDOMEN:  Soft, non-tender, no masses EXTREMITIES:  Moves all extremities well, without clubbing, cyanosis, or edema NEUROLOGIC:  Alert and oriented x 3, normal gait, CN II-XII grossly intact MENTAL STATUS:  appropriate BACK:  Non-tender to palpation, No CVAT SKIN:  Warm, dry, and intact   Results: U/A: Negative "

## 2024-07-03 NOTE — Addendum Note (Signed)
 Addended by: OBADIAH ROSELEE RAMAN on: 07/03/2024 11:41 AM   Modules accepted: Orders

## 2024-07-14 ENCOUNTER — Other Ambulatory Visit: Payer: Self-pay | Admitting: Family Medicine

## 2024-08-09 ENCOUNTER — Other Ambulatory Visit

## 2024-12-03 ENCOUNTER — Encounter: Admitting: Family Medicine
